# Patient Record
Sex: Female | Born: 1990 | Race: Black or African American | Hispanic: No | Marital: Single | Smoking: Never smoker
Health system: Southern US, Community
[De-identification: ages and names within clinical notes are randomized; demographics above are authoritative.]

## PROBLEM LIST (undated history)

## (undated) DIAGNOSIS — J45909 Unspecified asthma, uncomplicated: Secondary | ICD-10-CM

## (undated) HISTORY — PX: REFRACTIVE SURGERY: SHX103

---

## 2013-06-08 ENCOUNTER — Emergency Department (HOSPITAL_COMMUNITY)
Admission: EM | Admit: 2013-06-08 | Discharge: 2013-06-08 | Disposition: A | Discharge status: Home / Self Care | Payer: Federal, State, Local not specified - PPO | Attending: Emergency Medicine | Admitting: Emergency Medicine

## 2013-06-08 ENCOUNTER — Encounter (HOSPITAL_COMMUNITY): Payer: Self-pay | Admitting: *Deleted

## 2013-06-08 DIAGNOSIS — N949 Unspecified condition associated with female genital organs and menstrual cycle: Secondary | ICD-10-CM | POA: Insufficient documentation

## 2013-06-08 DIAGNOSIS — Z3202 Encounter for pregnancy test, result negative: Secondary | ICD-10-CM | POA: Insufficient documentation

## 2013-06-08 DIAGNOSIS — N926 Irregular menstruation, unspecified: Secondary | ICD-10-CM

## 2013-06-08 DIAGNOSIS — R110 Nausea: Secondary | ICD-10-CM | POA: Insufficient documentation

## 2013-06-08 DIAGNOSIS — N938 Other specified abnormal uterine and vaginal bleeding: Secondary | ICD-10-CM | POA: Insufficient documentation

## 2013-06-08 HISTORY — DX: Unspecified asthma, uncomplicated: J45.909

## 2013-06-08 LAB — WET PREP, GENITAL: Clue Cells Wet Prep HPF POC: NONE SEEN

## 2013-06-08 LAB — URINALYSIS, ROUTINE W REFLEX MICROSCOPIC
Bilirubin Urine: NEGATIVE
Protein, ur: NEGATIVE mg/dL
Urobilinogen, UA: 0.2 mg/dL (ref 0.0–1.0)

## 2013-06-08 LAB — POCT PREGNANCY, URINE: Preg Test, Ur: NEGATIVE

## 2013-06-08 LAB — URINE MICROSCOPIC-ADD ON

## 2013-06-08 MED ORDER — CEFTRIAXONE SODIUM 250 MG IJ SOLR
250.0000 mg | Freq: Once | INTRAMUSCULAR | Status: AC
  Administered 2013-06-08: 250 mg via INTRAMUSCULAR
  Filled 2013-06-08: qty 250

## 2013-06-08 MED ORDER — AZITHROMYCIN 1 G PO PACK
1.0000 g | PACK | Freq: Once | ORAL | Status: AC
  Administered 2013-06-08: 1 g via ORAL
  Filled 2013-06-08: qty 1

## 2013-06-08 MED ORDER — ONDANSETRON HCL 4 MG PO TABS: 4.0000 mg | ORAL_TABLET | Freq: Four times a day (QID) | ORAL | Status: DC

## 2013-06-08 NOTE — ED Provider Notes (Signed)
CSN: [id number]     Arrival date & time 06/08/13  1025 History   First MD Initiated Contact with Patient 06/08/13 1114     Chief Complaint  Patient presents with  . Asthma   (Consider location/radiation/quality/duration/timing/severity/associated sxs/prior Treatment) The history is provided by the patient. No language interpreter was used.  Isabel Scott is a 22 year old female with past medical history of asthma presenting to emergency department with vaginal discomfort has been ongoing starting this morning. Patient reported that when she woke up this morning she had a sharp pain in her vaginal region that lasted approximately 5-10 minutes, reported no more than 10 minutes. Patient reported that she normally gets vaginal discomfort when starting her menstruation, reported this was a different type of discomfort. Patient reported that she did notice that she had vaginal bleeding this morning, reported that her last menstrual period was 04/21/2013. Patient reported that she was due for her menstruation. Nothing makes the discomfort better or worse, denied using anything for the discomfort. Patient reported that she had her first sexual encounter on 04/12/2013, reported using no protection. Denied birth control. Patient denied any radiation. Patient denied fever, chills, chest pain, shortness of breath, difficulty breathing, neck pain, back pain, dysuria, sores, and vaginal discharge, abdominal pain, vomiting, diarrhea, melena, hematochezia. PCP none  Past Medical History  Diagnosis Date  . Asthma    History reviewed. No pertinent past surgical history. History reviewed. No pertinent family history. History  Substance Use Topics  . Smoking status: Not on file  . Smokeless tobacco: Not on file  . Alcohol Use: No   OB History   Grav Para Term Preterm Abortions TAB SAB Ect Mult Living                 Review of Systems  Constitutional: Negative for fever and chills.  HENT: Negative  for sore throat and trouble swallowing.   Eyes: Negative for visual disturbance.  Respiratory: Negative for chest tightness, shortness of breath and wheezing.   Cardiovascular: Negative for chest pain.  Gastrointestinal: Positive for nausea. Negative for vomiting, abdominal pain and diarrhea.  Genitourinary: Positive for vaginal bleeding and vaginal pain. Negative for dysuria, decreased urine volume, vaginal discharge and genital sores.  Musculoskeletal: Negative for back pain.  Neurological: Negative for dizziness and weakness.  All other systems reviewed and are negative.    Allergies  Apple  Home Medications   Current Outpatient Rx  Name  Route  Sig  Dispense  Refill  . ondansetron (ZOFRAN) 4 MG tablet   Oral   Take 1 tablet (4 mg total) by mouth every 6 (six) hours.   12 tablet   0    BP 102/80  Pulse 108  Temp(Src) 98.2 F (36.8 C) (Oral)  Resp 18  SpO2 100%  LMP 04/21/2013 Physical Exam  Nursing note and vitals reviewed. Constitutional: She is oriented to person, place, and time. She appears well-developed and well-nourished. No distress.  HENT:  Head: Normocephalic and atraumatic.  Mouth/Throat: Oropharynx is clear and moist. No oropharyngeal exudate.  No oral lesions or sores identified to the buccal mucosa and gum line  Eyes: Conjunctivae and EOM are normal. Pupils are equal, round, and reactive to light. Right eye exhibits no discharge. Left eye exhibits no discharge.  Neck: Normal range of motion. Neck supple.  Cardiovascular: Normal rate, regular rhythm and normal heart sounds.  Exam reveals no friction rub.   No murmur heard. Pulses:      Radial pulses  are 2+ on the right side, and 2+ on the left side.       Dorsalis pedis pulses are 2+ on the right side, and 2+ on the left side.  Pulmonary/Chest: Effort normal and breath sounds normal. No respiratory distress. She has no wheezes. She has no rales.  Abdominal: Soft. Bowel sounds are normal. She exhibits no  distension. There is tenderness. There is no rebound and no guarding.  Suprapubic discomfort identified  Genitourinary: Vagina normal.  Negative swelling, erythema, inflammation, sores, lesions noted to the external genitalia. Negative swelling, erythema, inflammation, sores, lesions noted to the vaginal canal. Cervix normal appearance without inflammation, erythema, strawberry appearance identified. Bleeding noted from the cervical os-patient currently menstruating. Negative bilateral adnexal tenderness. Negative CMT. Mild discomfort upon palpation to suprapubic region.  Musculoskeletal: Normal range of motion.  Lymphadenopathy:    She has no cervical adenopathy.  Neurological: She is alert and oriented to person, place, and time. She exhibits normal muscle tone. Coordination normal.  Skin: Skin is warm and dry. No rash noted. She is not diaphoretic. No erythema.  Psychiatric: She has a normal mood and affect. Her behavior is normal. Thought content normal.    ED Course  Procedures (including critical care time)  2:37 PM This provider at bedside discussing lab findings with patient. Discussed plan for discharge and patient agreed. Patient reported that she has been having onset of dizziness, weakness, and sweating with her first day of her peirods - has been ongoing for the past couple of months. Patient erporte dthat it only occurs with the first day of periods and then never comes back. Patient denied following up with her OBGYN regarding this. Patient reported that she is okay now. Denied chest pain, shortness of breath, difficulty breathing, abdominal pain, sweating, weakness, chills. Patient reported that she has been rather stressed out regarding school and home - patient reported that she is okay and denied depression. Patient reported that she has had a history of SI, but denied SI, HI, and self-inflicted injury - patient reported that she has not had these thoughts for a long time. Discussed  with patient safe sex habits and to be re-evaluated by OBGYN.   Labs Review Labs Reviewed  WET PREP, GENITAL - Abnormal; Notable for the following:    WBC, Wet Prep HPF POC FEW (*)    All other components within normal limits  URINALYSIS, ROUTINE W REFLEX MICROSCOPIC - Abnormal; Notable for the following:    APPearance CLOUDY (*)    Hgb urine dipstick SMALL (*)    All other components within normal limits  GC/CHLAMYDIA PROBE AMP  URINE MICROSCOPIC-ADD ON  POCT PREGNANCY, URINE   Imaging Review No results found.  MDM   1. Late menstruation     Patient presenting to emergency department with vaginal pain that has been ongoing since this morning. Patient reported that she just started menstruating this morning. Patient reported that she normally gets the discomfort within the vaginal region when starting her menstruation, reported that this discomfort was different. Alert and oriented. Negative acute abdomen, negative peritoneal signs. Mild discomfort upon palpation to the suprapubic region. Unremarkable pelvic exam-patient currently menstruating. Urine pregnancy negative. Urinalysis identified small amount of hemoglobin-patient currently menstruating-negative findings infection or pyelonephritis. Wet prep with few white blood cells identified. Patient stable, afebrile. Menstruation with menorrhagia. Discharged patient. Patient covered prophylactically. Discussed with patient to follow-up with OBGYN by next week. Discussed with patient to refrain from sexually activity - educated patient on safe  sex habits. Discussed with patient to get iron check to see if there may be a deficiency. Recommended over the counter vitamins, daily, to be taken. Discussed with patient to continue to monitor symptoms and if symptoms are to worsen or change to report back to the ED - strict return instructions given.  Patient agreed to plan of care, understood, all questions answered.     Raymon Mutton,  PA-C 06/09/13 [phone]

## 2013-06-08 NOTE — ED Notes (Addendum)
Pt arrived to ed very anxious and hyperventilating. Pt and family member reports hx of asthma, spo2 100% at triage. Pt calmed down at triage, slowed resp rate down. No wheezing noted at triage.

## 2013-06-08 NOTE — ED Notes (Signed)
Pelvic cart set up and ready 

## 2013-06-09 NOTE — ED Provider Notes (Signed)
Medical screening examination/treatment/procedure(s) were performed by non-physician practitioner and as supervising physician I was immediately available for consultation/collaboration.   Charles B. Sheldon, MD 06/09/13 1922 

## 2013-07-26 ENCOUNTER — Encounter: Payer: Self-pay | Admitting: Obstetrics & Gynecology

## 2013-07-26 ENCOUNTER — Encounter: Payer: Self-pay | Admitting: Nurse Practitioner

## 2013-07-26 ENCOUNTER — Ambulatory Visit (INDEPENDENT_AMBULATORY_CARE_PROVIDER_SITE_OTHER): Payer: Federal, State, Local not specified - PPO | Admitting: Nurse Practitioner

## 2013-07-26 VITALS — BP 127/75 | HR 83 | Temp 98.7°F | Ht 69.0 in | Wt 199.0 lb

## 2013-07-26 DIAGNOSIS — Z309 Encounter for contraceptive management, unspecified: Secondary | ICD-10-CM | POA: Insufficient documentation

## 2013-07-26 NOTE — Progress Notes (Signed)
Subjective:     Patient ID: Isabel Scott, female   DOB: 05/07/1991, 22 y.o.   MRN: [id number]  HPI Comments: Pt comes to [facility] today following an ER visit in which she had some unusual sensations following an asthma attack. The ER felt that she should get started on some birth control following one sexual experience. She was given Plan B by Dr Fredia Sorrow. She felt this was a one time situation in which she wanted to experience sex only. She is not interested in birth control and does not feel she will be sexually active again. She is in school for Biology and wants to go to PA school in [place].      Review of Systems  Constitutional: Negative.   HENT: Negative.   Eyes: Negative.   Respiratory: Negative.   Cardiovascular: Negative.   Gastrointestinal: Negative.   Endocrine: Negative.   Genitourinary: Negative.   Neurological: Negative.   Hematological: Negative.   Psychiatric/Behavioral: Negative.        Objective:   Physical Exam  Constitutional: She is oriented to person, place, and time. She appears well-developed and well-nourished.  HENT:  Head: Normocephalic and atraumatic.  Eyes: Conjunctivae are normal. Pupils are equal, round, and reactive to light.  Neck: Normal range of motion.  Cardiovascular: Normal rate, regular rhythm and normal heart sounds.   Pulmonary/Chest: Breath sounds normal.  Abdominal: Bowel sounds are normal.  Genitourinary:  Not examined  Musculoskeletal: Normal range of motion.  Neurological: She is alert and oriented to person, place, and time.  Skin: Skin is warm and dry.  Psychiatric: She has a normal mood and affect. Her behavior is normal. Judgment normal.       Assessment:     Contraception    Plan:     Continue with Plan B as needed Return if she does start relationship RTC one year for first pap smear

## 2013-07-26 NOTE — Patient Instructions (Signed)
Contraception Choices  Contraception (birth control) is the use of any methods or devices to prevent pregnancy. Below are some methods to help avoid pregnancy.  HORMONAL METHODS   · Contraceptive implant. This is a thin, plastic tube containing progesterone hormone. It does not contain estrogen hormone. Your caregiver inserts the tube in the inner part of the upper arm. The tube can remain in place for up to 3 years. After 3 years, the implant must be removed. The implant prevents the ovaries from releasing an egg (ovulation), thickens the cervical mucus which prevents sperm from entering the uterus, and thins the lining of the inside of the uterus.  · Progesterone-only injections. These injections are given every 3 months by your caregiver to prevent pregnancy. This synthetic progesterone hormone stops the ovaries from releasing eggs. It also thickens cervical mucus and changes the uterine lining. This makes it harder for sperm to survive in the uterus.  · Birth control pills. These pills contain estrogen and progesterone hormone. They work by stopping the egg from forming in the ovary (ovulation). Birth control pills are prescribed by a caregiver. Birth control pills can also be used to treat heavy periods.  · Minipill. This type of birth control pill contains only the progesterone hormone. They are taken every day of each month and must be prescribed by your caregiver.  · Birth control patch. The patch contains hormones similar to those in birth control pills. It must be changed once a week and is prescribed by a caregiver.  · Vaginal ring. The ring contains hormones similar to those in birth control pills. It is left in the vagina for 3 weeks, removed for 1 week, and then a new one is put back in place. The patient must be comfortable inserting and removing the ring from the vagina. A caregiver's prescription is necessary.  · Emergency contraception. Emergency contraceptives prevent pregnancy after unprotected  sexual intercourse. This pill can be taken right after sex or up to 5 days after unprotected sex. It is most effective the sooner you take the pills after having sexual intercourse. Emergency contraceptive pills are available without a prescription. Check with your pharmacist. Do not use emergency contraception as your only form of birth control.  BARRIER METHODS   · Female condom. This is a thin sheath (latex or rubber) that is worn over the penis during sexual intercourse. It can be used with spermicide to increase effectiveness.  · Female condom. This is a soft, loose-fitting sheath that is put into the vagina before sexual intercourse.  · Diaphragm. This is a soft, latex, dome-shaped barrier that must be fitted by a caregiver. It is inserted into the vagina, along with a spermicidal jelly. It is inserted before intercourse. The diaphragm should be left in the vagina for 6 to 8 hours after intercourse.  · Cervical cap. This is a round, soft, latex or plastic cup that fits over the cervix and must be fitted by a caregiver. The cap can be left in place for up to 48 hours after intercourse.  · Sponge. This is a soft, circular piece of polyurethane foam. The sponge has spermicide in it. It is inserted into the vagina after wetting it and before sexual intercourse.  · Spermicides. These are chemicals that kill or block sperm from entering the cervix and uterus. They come in the form of creams, jellies, suppositories, foam, or tablets. They do not require a prescription. They are inserted into the vagina with an applicator before having sexual intercourse.   The process must be repeated every time you have sexual intercourse.  INTRAUTERINE CONTRACEPTION  · Intrauterine device (IUD). This is a T-shaped device that is put in a woman's uterus during a menstrual period to prevent pregnancy. There are 2 types:  · Copper IUD. This type of IUD is wrapped in copper wire and is placed inside the uterus. Copper makes the uterus and  fallopian tubes produce a fluid that kills sperm. It can stay in place for 10 years.  · Hormone IUD. This type of IUD contains the hormone progestin (synthetic progesterone). The hormone thickens the cervical mucus and prevents sperm from entering the uterus, and it also thins the uterine lining to prevent implantation of a fertilized egg. The hormone can weaken or kill the sperm that get into the uterus. It can stay in place for 5 years.  PERMANENT METHODS OF CONTRACEPTION  · Female tubal ligation. This is when the woman's fallopian tubes are surgically sealed, tied, or blocked to prevent the egg from traveling to the uterus.  · Female sterilization. This is when the female has the tubes that carry sperm tied off (vasectomy). This blocks sperm from entering the vagina during sexual intercourse. After the procedure, the man can still ejaculate fluid (semen).  NATURAL PLANNING METHODS  · Natural family planning. This is not having sexual intercourse or using a barrier method (condom, diaphragm, cervical cap) on days the woman could become pregnant.  · Calendar method. This is keeping track of the length of each menstrual cycle and identifying when you are fertile.  · Ovulation method. This is avoiding sexual intercourse during ovulation.  · Symptothermal method. This is avoiding sexual intercourse during ovulation, using a thermometer and ovulation symptoms.  · Post-ovulation method. This is timing sexual intercourse after you have ovulated.  Regardless of which type or method of contraception you choose, it is important that you use condoms to protect against the transmission of sexually transmitted diseases (STDs). Talk with your caregiver about which form of contraception is most appropriate for you.  Document Released: 09/21/2005 Document Revised: 12/14/2011 Document Reviewed: 01/28/2011  [employer]® Patient Information ©2014 [employer].

## 2013-10-04 HISTORY — PX: TOOTH EXTRACTION: SUR596

## 2013-10-08 ENCOUNTER — Emergency Department (HOSPITAL_COMMUNITY): Payer: Federal, State, Local not specified - PPO

## 2013-10-08 ENCOUNTER — Emergency Department (HOSPITAL_COMMUNITY)
Admission: EM | Admit: 2013-10-08 | Discharge: 2013-10-08 | Disposition: A | Discharge status: Home / Self Care | Payer: Federal, State, Local not specified - PPO | Attending: Emergency Medicine | Admitting: Emergency Medicine

## 2013-10-08 ENCOUNTER — Encounter (HOSPITAL_COMMUNITY): Payer: Self-pay | Admitting: Emergency Medicine

## 2013-10-08 DIAGNOSIS — J45909 Unspecified asthma, uncomplicated: Secondary | ICD-10-CM | POA: Insufficient documentation

## 2013-10-08 DIAGNOSIS — R079 Chest pain, unspecified: Secondary | ICD-10-CM | POA: Insufficient documentation

## 2013-10-08 LAB — CBC
HEMATOCRIT: 41.1 % (ref 36.0–46.0)
HEMOGLOBIN: 13.3 g/dL (ref 12.0–15.0)
MCH: 25.3 pg — AB (ref 26.0–34.0)
MCHC: 32.4 g/dL (ref 30.0–36.0)
MCV: 78.3 fL (ref 78.0–100.0)
Platelets: 214 10*3/uL (ref 150–400)
RBC: 5.25 MIL/uL — AB (ref 3.87–5.11)
RDW: 12.9 % (ref 11.5–15.5)
WBC: 6.2 10*3/uL (ref 4.0–10.5)

## 2013-10-08 LAB — BASIC METABOLIC PANEL
BUN: 9 mg/dL (ref 6–23)
CALCIUM: 8.8 mg/dL (ref 8.4–10.5)
CO2: 28 meq/L (ref 19–32)
Chloride: 101 mEq/L (ref 96–112)
Creatinine, Ser: 0.78 mg/dL (ref 0.50–1.10)
GFR calc Af Amer: >90 mL/min (ref >90)
GLUCOSE: 114 mg/dL — AB (ref 70–99)
Potassium: 3.8 mEq/L (ref 3.7–5.3)
Sodium: 139 mEq/L (ref 137–147)

## 2013-10-08 LAB — POCT I-STAT TROPONIN I: Troponin i, poc: 0 ng/mL (ref 0.00–0.08)

## 2013-10-08 MED ORDER — FAMOTIDINE 20 MG PO TABS: 20.0000 mg | ORAL_TABLET | Freq: Two times a day (BID) | ORAL | Status: DC

## 2013-10-08 MED ORDER — GI COCKTAIL ~~LOC~~
30.0000 mL | Freq: Once | ORAL | Status: AC
  Administered 2013-10-08: 30 mL via ORAL
  Filled 2013-10-08: qty 30

## 2013-10-08 NOTE — ED Notes (Signed)
Patient returned from X-ray 

## 2013-10-08 NOTE — ED Notes (Signed)
Pt c/o intermittent left sided chest pain for a couple of days. Pt reports when she burps she gets a burning pain in her left chest area. Pt currently pain free at present.

## 2013-10-08 NOTE — ED Notes (Signed)
Patient transported to X-ray 

## 2013-10-08 NOTE — Discharge Instructions (Signed)
Take Pepcid for reflux - twice daily (this is over the counter as well)  Try Maalox as well to help treat your symptoms  Follow-up with your doctor  Return to the ED if you have any difficulty swallowing/breathing, change/worsening chest pain, repeated vomiting, difficulty breathing/shortness of breath, fever, or any other concerns (see below)     Chest Pain (Nonspecific) It is often hard to give a specific diagnosis for the cause of chest pain. There is always a chance that your pain could be related to something serious, such as a heart attack or a blood clot in the lungs. You need to follow up with your caregiver for further evaluation. CAUSES   Heartburn.  Pneumonia or bronchitis.  Anxiety or stress.  Inflammation around your heart (pericarditis) or lung (pleuritis or pleurisy).  A blood clot in the lung.  A collapsed lung (pneumothorax). It can develop suddenly on its own (spontaneous pneumothorax) or from injury (trauma) to the chest.  Shingles infection (herpes zoster virus). The chest wall is composed of bones, muscles, and cartilage. Any of these can be the source of the pain.  The bones can be bruised by injury.  The muscles or cartilage can be strained by coughing or overwork.  The cartilage can be affected by inflammation and become sore (costochondritis). DIAGNOSIS  Lab tests or other studies, such as X-rays, electrocardiography, stress testing, or cardiac imaging, may be needed to find the cause of your pain.  TREATMENT   Treatment depends on what may be causing your chest pain. Treatment may include:  Acid blockers for heartburn.  Anti-inflammatory medicine.  Pain medicine for inflammatory conditions.  Antibiotics if an infection is present.  You may be advised to change lifestyle habits. This includes stopping smoking and avoiding alcohol, caffeine, and chocolate.  You may be advised to keep your head raised (elevated) when sleeping. This reduces the  chance of acid going backward from your stomach into your esophagus.  Most of the time, nonspecific chest pain will improve within 2 to 3 days with rest and mild pain medicine. HOME CARE INSTRUCTIONS   If antibiotics were prescribed, take your antibiotics as directed. Finish them even if you start to feel better.  For the next few days, avoid physical activities that bring on chest pain. Continue physical activities as directed.  Do not smoke.  Avoid drinking alcohol.  Only take over-the-counter or prescription medicine for pain, discomfort, or fever as directed by your caregiver.  Follow your caregiver's suggestions for further testing if your chest pain does not go away.  Keep any follow-up appointments you made. If you do not go to an appointment, you could develop lasting (chronic) problems with pain. If there is any problem keeping an appointment, you must call to reschedule. SEEK MEDICAL CARE IF:   You think you are having problems from the medicine you are taking. Read your medicine instructions carefully.  Your chest pain does not go away, even after treatment.  You develop a rash with blisters on your chest. SEEK IMMEDIATE MEDICAL CARE IF:   You have increased chest pain or pain that spreads to your arm, neck, jaw, back, or abdomen.  You develop shortness of breath, an increasing cough, or you are coughing up blood.  You have severe back or abdominal pain, feel nauseous, or vomit.  You develop severe weakness, fainting, or chills.  You have a fever. THIS IS AN EMERGENCY. Do not wait to see if the pain will go away. Get medical  help at once. Call your local emergency services (911 in U.[place].). Do not drive yourself to the hospital. MAKE SURE YOU:   Understand these instructions.  Will watch your condition.  Will get help right away if you are not doing well or get worse. Document Released: 07/01/2005 Document Revised: 12/14/2011 Document Reviewed:  04/26/2008 [facility] Patient Information 2014 [place], [place].  Gastroesophageal Reflux Disease, Adult Gastroesophageal reflux disease (GERD) happens when acid from your stomach flows up into the esophagus. When acid comes in contact with the esophagus, the acid causes soreness (inflammation) in the esophagus. Over time, GERD may create small holes (ulcers) in the lining of the esophagus. CAUSES   Increased body weight. This puts pressure on the stomach, making acid rise from the stomach into the esophagus.  Smoking. This increases acid production in the stomach.  Drinking alcohol. This causes decreased pressure in the lower esophageal sphincter (valve or ring of muscle between the esophagus and stomach), allowing acid from the stomach into the esophagus.  Late evening meals and a full stomach. This increases pressure and acid production in the stomach.  A malformed lower esophageal sphincter. Sometimes, no cause is found. SYMPTOMS   Burning pain in the lower part of the mid-chest behind the breastbone and in the mid-stomach area. This may occur twice a week or more often.  Trouble swallowing.  Sore throat.  Dry cough.  Asthma-like symptoms including chest tightness, shortness of breath, or wheezing. DIAGNOSIS  Your caregiver may be able to diagnose GERD based on your symptoms. In some cases, X-rays and other tests may be done to check for complications or to check the condition of your stomach and esophagus. TREATMENT  Your caregiver may recommend over-the-counter or prescription medicines to help decrease acid production. Ask your caregiver before starting or adding any new medicines.  HOME CARE INSTRUCTIONS   Change the factors that you can control. Ask your caregiver for guidance concerning weight loss, quitting smoking, and alcohol consumption.  Avoid foods and drinks that make your symptoms worse, such as:  Caffeine or alcoholic drinks.  Chocolate.  Peppermint or mint  flavorings.  Garlic and onions.  Spicy foods.  Citrus fruits, such as oranges, lemons, or limes.  Tomato-based foods such as sauce, chili, salsa, and pizza.  Fried and fatty foods.  Avoid lying down for the 3 hours prior to your bedtime or prior to taking a nap.  Eat small, frequent meals instead of large meals.  Wear loose-fitting clothing. Do not wear anything tight around your waist that causes pressure on your stomach.  Raise the head of your bed 6 to 8 inches with wood blocks to help you sleep. Extra pillows will not help.  Only take over-the-counter or prescription medicines for pain, discomfort, or fever as directed by your caregiver.  Do not take aspirin, ibuprofen, or other nonsteroidal anti-inflammatory drugs (NSAIDs). SEEK IMMEDIATE MEDICAL CARE IF:   You have pain in your arms, neck, jaw, teeth, or back.  Your pain increases or changes in intensity or duration.  You develop nausea, vomiting, or sweating (diaphoresis).  You develop shortness of breath, or you faint.  Your vomit is green, yellow, black, or looks like coffee grounds or blood.  Your stool is red, bloody, or black. These symptoms could be signs of other problems, such as heart disease, gastric bleeding, or esophageal bleeding. MAKE SURE YOU:   Understand these instructions.  Will watch your condition.  Will get help right away if you are not doing well  or get worse. Document Released: 07/01/2005 Document Revised: 12/14/2011 Document Reviewed: 04/10/2011 [facility] Patient Information 2014 [place], [place].   Emergency Department Resource Guide 1) Find a Doctor and Pay Out of Pocket Although you won't have to find out who is covered by your insurance plan, it is a good idea to ask around and get recommendations. You will then need to call the office and see if the doctor you have chosen will accept you as a new patient and what types of options they offer for patients who are self-pay. Some  doctors offer discounts or will set up payment plans for their patients who do not have insurance, but you will need to ask so you aren't surprised when you get to your appointment.  2) Contact Your Local Health Department Not all health departments have doctors that can see patients for sick visits, but many do, so it is worth a call to see if yours does. If you don't know where your local health department is, you can check in your phone book. The CDC also has a tool to help you locate your state's health department, and many state websites also have listings of all of their local health departments.  3) Find a Walk-in Clinic If your illness is not likely to be very severe or complicated, you may want to try a walk in clinic. These are popping up all over the country in pharmacies, drugstores, and shopping centers. They're usually staffed by nurse practitioners or physician assistants that have been trained to treat common illnesses and complaints. They're usually fairly quick and inexpensive. However, if you have serious medical issues or chronic medical problems, these are probably not your best option.  No Primary Care Doctor: - Call Health Connect at  [phone] - they can help you locate a primary care doctor that  accepts your insurance, provides certain services, etc. - Physician Referral Service- [phone]  Chronic Pain Problems: Organization         Address  Phone   Notes  [facility]  [phone] Patients need to be referred by their primary care doctor.   Medication Assistance: Organization         Address  Phone   Notes  [facility] [address] [place], [place] [address] [phone]) [phone] --Must be a resident of [facility] -- Must have NO insurance coverage whatsoever (no Medicaid/ Medicare, etc.) -- The pt. MUST have a primary care doctor that directs their care regularly and follows them in the  community   Med[facility]ist  [phone]   [employer]  [phone]    Agencies that provide inexpensive medical care: Organization         Address  Phone   Notes  [facility]  [phone]   [facility]    [phone]   [facility] [address] [place], [place] [address] [phone]   [facility] [address], [place] [phone]) [phone]   [facility]    [phone]   [facility]    [phone]   [facility]  [address], [place] Phone:  [phone], Fax:  [phone] Hours of Operation:  9 am - 6 pm, M-F.  Also accepts Medicaid/Medicare and self-pay.  [facility]  [address], [place] Phone: [phone]) [phone], Fax: [phone].  Hours of Operation:  8:30 am - 5:30 pm, M-F.  Also accepts Medicaid and self-pay.  [facility] [place] [address], [place] Phone: [phone]   [facility] [address] Natasha Bence [place], [place] [phone], Ext. [phone] Mondays & Thursdays: 7-9 AM.  First 15 patients are seen on a first come, first serve basis.    Medicaid-accepting [facility] Providers:  Organization         Address  Phone   Notes  [facility] [address], [place] [phone] Also accepts self-pay patients.  [facility] [address] Laurell Josephs [place], [place]  [phone]   [facility] [address], [place] [phone]   [facility] [address], [place] [phone]   Renaye Rakers [address], [place]   [phone] Only accepts [place] patients after they have their name applied to their card.   Self-Pay (no insurance) in [facility]:  Organization         Address  Phone   Notes  Sickle Cell Patients, [facility]  Internal Medicine [address] [place], [place] [phone]   [facility] [address] [place], [place] [phone]   [facility]  [address], [place] [phone]   [facility]/Dr. Osei-Bonsu  [address] [place] or [address], [place] [phone] Phone number for both [place] locations is the same.  [facility] [address], [place] [phone]   [facility] [address], [place] or [address] [phone]   [facility] [address] [place] [phone], phone; [phone], fax Sees patients 1st and 3rd Saturday of every month.  Must not qualify for public or private insurance (i.e. Medicaid, Medicare, [place] Health Choice, Veterans' Benefits)  Household income should be no more than 200% of the poverty level The clinic cannot treat you if you are pregnant or think you are pregnant  Sexually transmitted diseases are not treated at the clinic.    Dental Care: Organization         Address  Phone  Notes  [facility] Department of [facility] [address] [place], [place] [phone] Accepts children up to age 12 who are enrolled in [place] or [place] Health Choice; pregnant women with a Medicaid card; and children who have applied for Medicaid or [place] Health Choice, but were declined, whose parents can pay a reduced fee at time of service.  [facility]  [address], [place] [phone] Accepts children up to age 15 who are enrolled in [place] or [place] Health Choice; pregnant women with a Medicaid card; and children who have applied for Medicaid or [place] Health Choice, but were declined, whose parents can pay a reduced fee at time of service.  [facility]  [address] [place], [place] [phone] Patients are seen by appointment only. Walk-ins are not accepted. [facility] will see patients 36 years of age and older. Monday - Tuesday (8am-5pm) Most Wednesdays (8:30-5pm) $30 per visit, cash only  [facility] [employer] PRO[facility]  [address], [facility] [phone] Patients  are seen by appointment only. Walk-ins are not accepted. [facility] will see patients 19 years of age and older. One Wednesday Evening (Monthly: Volunteer Based).  $30 per visit, cash only  [employer]  [phone] for adults; Children under age 5, call Graduate Pediatric Dentistry at [phone]. Children aged 25-14, please call [phone] to request a pediatric application.  Dental services are provided in all areas of dental care including fillings, crowns and bridges, complete and partial dentures, implants, gum treatment, root canals, and extractions. Preventive care is also provided. Treatment is provided to both adults and children. Patients are selected via a lottery and there is often a waiting list.   [facility] [address], [address]  [phone] [email or web address]   [facility] [address] [place], [place] [phone], Ext. [phone] Second and Fourth Thursday of each month, opens at 6:30 AM; Clinic ends at 9 AM.  Patients are seen on a first-come first-served basis, and a limited number are seen during each clinic.   [facility]  [address] Ether Griffins [place], [place] [phone]   Eligibility Requirements You must have lived in [place], [place], or [place] counties for at least the last three months.   You cannot be eligible for state or federal sponsored [place], including [employer], [place], or [employer].   You generally cannot be eligible for healthcare insurance through your employer.    How to apply: Eligibility screenings are held every  Tuesday and Wednesday afternoon from 1:00 pm until 4:00 pm. You do not need an appointment for the interview!  [facility] [address] [place], [place] [phone]   [facility] Health Department  [phone]   [facility] Health Department  [phone]   [facility] Health Department  [phone]    Behavioral Health Resources in the Community: Intensive Outpatient Programs Organization         Address  Phone  Notes  [facility] Services [address] [place], [place] [phone]   [facility] [address] [place], [place] [phone]   [facility] [address], [place], [place]  [phone]   [facility] [address]  [place], [place] [phone] or [phone]   Substance Abuse Resources Organization         Address  Phone  Notes  [facility]  [phone]   [facility]  [phone]   [facility] [place]  [phone]   Floydene Flock  [phone]   Residential & Outpatient Substance Abuse Program  [phone]   Psychological Services Organization         Address  Phone  Notes  [facility]  [phone]   [facility]  [phone]   [facility] [address] [place] [phone] or [phone]    Mobile Crisis Teams Organization         Address  Phone  Notes  [facility], Mobile Crisis Care Unit  [phone]   [facility]  [address] [place], [place] [phone]   Doristine Locks [address] [place] [phone]    Self-Help/Support Groups Organization         Address  Phone             Notes  [facility] [place] - variety of support groups  336- I7437963 Call for  more information  Narcotics Anonymous (NA), [facility] [address], [employer] [place]  2 meetings at this location    Residential Sports administrator         Address  Phone  Notes  [facility] [address],    [place]  [phone]   [facility]  [address] [place] [address], [place], [place] [phone]   [facility] [address] [place], [place] [phone] Admissions: 8am-3pm M-F  [facility] [address]    [place], [place] [phone]   [facility] [address] [place], [place], [place] [phone]   [facility] [address]  [place], [place] [phone]   [facility] - Intensive Outpatient [address] Laurell Josephs [address], [place], [place] [phone]   [facility] ([facility] [address]  [place], [place] [phone] or [phone]   [facility] ([facility]) [address] [place], [place] [phone] Accepts Medicaid  [facility] [address],  [place] [phone] Substance Abuse/Addiction Treatment   [facility] Organization         Address  Phone  Notes  [facility]  [phone]   Angie Fava, PhD [address] Ervin Knack [place], [place]   [phone] or [phone]   [facility]   [address] [place], [place] [phone]   [facility] [address] [place], [place] [phone] Insurance/Medicaid/sponsorship through [facility] and Families [address]                                    [place], [place] [phone] Therapy/tele-psych/case  [facility] [address], [place] [phone]    Dr. Lolly Mustache  [phone]   [facility]  [employer] [facility] Dept. 1) [address] [place] 2) [address], [place] 3)  [address], [place] [phone]  [phone]   [facility] Child Abuse Hotline 321-150-5311 or [phone] (After  Hours)

## 2013-10-08 NOTE — ED Notes (Signed)
Patient states she had an episode of the burning while in X-Ray, but is was much less than before the GI cocktail. States she feels much better and is smiling.

## 2013-10-08 NOTE — ED Provider Notes (Signed)
CSN: [id number]     Arrival date & time 10/08/13  1736 History   First MD Initiated Contact with Patient 10/08/13 1826     Chief Complaint  Patient presents with  . Chest Pain    HPI  Isabel Scott is a 23 y.o. female with a PMH of asthma who presents to the ED for evaluation of chest pain.  History was provided by the patient.  Patient states that she has had intermittent chest pain for the past 3 days. Her chest pain has been changing in nature. Chest pain initially was located on the left side of her chest but is now located more in the mid sternal region. She describes her pain as a burning sensation. Her pain comes and goes in waves. Her pain lasts a few seconds to minutes in duration. Time between episodes varies between minutes and hours. She's never had chest pain like this in the past. She states that she has been burping, which seems to help her pain. She denies any chest pain currently. She has not taken anything to help treat her symptoms. She denies any associated symptoms with her chest pain including diaphoresis, shortness of breath, nausea, or vomiting. She otherwise been well with no fever, chills, rhinorrhea, cough, abdominal pain, diarrhea, headache, or weakness. She states she's been eating and drinking without difficulty however sometimes feels like her "food gets stuck" sometimes.  Patient denies any history of cardiac problems in the past. She denies any history of DVT/PE/cancer/recent travel.  No hx of tobacco use.  No significant FH of cardiac disease.     Past Medical History  Diagnosis Date  . Asthma    Past Surgical History  Procedure Laterality Date  . Tooth extraction     No family history on file. History  Substance Use Topics  . Smoking status: Never Smoker   . Smokeless tobacco: Not on file  . Alcohol Use: No   OB History   Grav Para Term Preterm Abortions TAB SAB Ect Mult Living                 Review of Systems  Constitutional: Negative for  fever, chills, diaphoresis, activity change, appetite change and fatigue.  HENT: Negative for congestion, rhinorrhea and sore throat.   Eyes: Negative for visual disturbance.  Respiratory: Negative for cough, shortness of breath and wheezing.   Cardiovascular: Positive for chest pain. Negative for palpitations and leg swelling.  Gastrointestinal: Negative for nausea, vomiting, abdominal pain, diarrhea and constipation.  Genitourinary: Negative for dysuria.  Musculoskeletal: Negative for back pain and myalgias.  Skin: Negative for wound.  Neurological: Negative for dizziness, weakness, light-headedness and headaches.    Allergies  Apple  Home Medications  No current outpatient prescriptions on file. BP 140/85  Pulse 97  Temp(Src) 98.8 F (37.1 C) (Oral)  Resp 18  Ht 5\' 9"  (1.753 m)  Wt 194 lb (87.998 kg)  BMI 28.64 kg/m2  SpO2 100%  LMP 09/13/2013  Filed Vitals:   10/08/13 1745 10/08/13 1910 10/08/13 1951 10/08/13 2045  BP: 140/85 115/60 105/56 111/57  Pulse: 97 78 64 58  Temp: 98.8 F (37.1 C)     TempSrc: Oral     Resp: 18 20 17 13   Height: 5\' 9"  (1.753 m)     Weight: 194 lb (87.998 kg)     SpO2: 100% 100% 100% 99%    Physical Exam  Nursing note and vitals reviewed. Constitutional: She is oriented to person, place, and  time. She appears well-developed and well-nourished. No distress.  HENT:  Head: Normocephalic and atraumatic.  Right Ear: External ear normal.  Left Ear: External ear normal.  Nose: Nose normal.  Mouth/Throat: Oropharynx is clear and moist. No oropharyngeal exudate.  Eyes: Conjunctivae are normal. Right eye exhibits no discharge. Left eye exhibits no discharge.  Neck: Normal range of motion. Neck supple.  Cardiovascular: Normal rate, regular rhythm, normal heart sounds and intact distal pulses.  Exam reveals no gallop and no friction rub.   No murmur heard. Dorsalis pedis pulses present and equal bilaterally  Pulmonary/Chest: Effort normal and  breath sounds normal. No respiratory distress. She has no wheezes. She has no rales. She exhibits no tenderness.  Abdominal: Soft. Bowel sounds are normal. She exhibits no distension and no mass. There is no tenderness. There is no rebound and no guarding.  Musculoskeletal: Normal range of motion. She exhibits no edema and no tenderness.  No pedal edema or calf tenderness bilaterally.    Neurological: She is alert and oriented to person, place, and time.  Skin: Skin is warm and dry. She is not diaphoretic.    ED Course  Procedures (including critical care time) Labs Review Labs Reviewed  CBC - Abnormal; Notable for the following:    RBC 5.25 (*)    MCH 25.3 (*)    All other components within normal limits  BASIC METABOLIC PANEL - Abnormal; Notable for the following:    Glucose, Bld 114 (*)    All other components within normal limits  POCT I-STAT TROPONIN I   Imaging Review No results found.  EKG Interpretation    Date/Time:  Sunday October 08 2013 17:38:55 EST Ventricular Rate:  97 PR Interval:  142 QRS Duration: 74 QT Interval:  366 QTC Calculation: 464 R Axis:   85 Text Interpretation:  Normal sinus rhythm Right atrial enlargement Borderline ECG No old tracing to compare Confirmed by Ethelda Chick  MD, SAM ([id number]) on 10/08/2013 8:32:49 PM           Results for orders placed during the hospital encounter of 10/08/13  CBC      Result Value Range   WBC 6.2  4.0 - 10.5 K/uL   RBC 5.25 (*) 3.87 - 5.11 MIL/uL   Hemoglobin 13.3  12.0 - 15.0 g/dL   HCT 65.7  84.6 - 96.2 %   MCV 78.3  78.0 - 100.0 fL   MCH 25.3 (*) 26.0 - 34.0 pg   MCHC 32.4  30.0 - 36.0 g/dL   RDW 95.2  84.1 - 32.4 %   Platelets 214  150 - 400 K/uL  BASIC METABOLIC PANEL      Result Value Range   Sodium 139  137 - 147 mEq/L   Potassium 3.8  3.7 - 5.3 mEq/L   Chloride 101  96 - 112 mEq/L   CO2 28  19 - 32 mEq/L   Glucose, Bld 114 (*) 70 - 99 mg/dL   BUN 9  6 - 23 mg/dL   Creatinine, Ser 4.01  0.50 -  1.10 mg/dL   Calcium 8.8  8.4 - 02.7 mg/dL   GFR calc non Af Amer >90  >90 mL/min   GFR calc Af Amer >90  >90 mL/min  POCT I-STAT TROPONIN I      Result Value Range   Troponin i, poc 0.00  0.00 - 0.08 ng/mL   Comment 3  DG Chest 2 View (Final result)  Result time: 10/08/13 19:32:05    Final result by Rad Results In Interface (10/08/13 19:32:05)    Narrative:   CLINICAL DATA: Chest pain  EXAM: CHEST 2 VIEW  COMPARISON: None.  FINDINGS: Normal cardiac silhouette. Levoscoliosis of the thoracic spine. No effusion, infiltrate, pneumothorax. No acute osseous abnormality.  IMPRESSION: No acute cardiopulmonary process.   Electronically Signed By: Genevive BiStewart Edmunds M.D. On: 10/08/2013 19:32    MDM   Laurin Coderurquoia Linward HeadlandMeshell Arnold is a 23 y.o. female with a PMH of asthma who presents to the ED for evaluation of chest pain.     Rechecks  8:00 PM = Patient states she feels much better after GI cocktail.  Eating crackers and drinking fluids with no difficulty.      Patient evaluated in the emergency department for chest pain. Chest pain likely GI related, possibly reflux/GERD. Patient had improvement in her symptoms after GI cocktail. She describes a burning epigastric pain. Abdominal exam benign.  Her troponin was negative. EKG negative for any acute ischemic changes. Chest x-ray negative for an acute cardiopulmonary process. Labs otherwise unremarkable. Patient afebrile and non-toxic.  Patient is young healthy female with no risk factors for cardiac disease. She has no respiratory complaints including cough, shortness of breath or dyspnea. Patient was prescribed Pepcid and encouraged to try over-the-counter Maalox. She was encouraged to followup with her [facility] who she still is established with. Return precautions, discharge instructions, and follow-up was discussed with the patient before discharge.  Patient was in agreement with discharge  and plan.     Discharge Medication List as of 10/08/2013  8:50 PM    START taking these medications   Details  famotidine (PEPCID) 20 MG tablet Take 1 tablet (20 mg total) by mouth 2 (two) times daily., Starting 10/08/2013, Until Discontinued, Print         Final impressions: 1. Chest pain      Luiz IronJessica Katlin Suleika Donavan PA-C   This patient was discussed with Dr. Ethelda ChickJacubowitz who did not evaluate the patient         Jillyn LedgerJessica K Bethanny Toelle, PA-C 10/09/13 0104

## 2013-10-11 NOTE — ED Provider Notes (Signed)
Medical screening examination/treatment/procedure(s) were performed by non-physician practitioner and as supervising physician I was immediately available for consultation/collaboration.  EKG Interpretation    Date/Time:  Sunday October 08 2013 17:38:55 EST Ventricular Rate:  97 PR Interval:  142 QRS Duration: 74 QT Interval:  366 QTC Calculation: 464 R Axis:   85 Text Interpretation:  Normal sinus rhythm Right atrial enlargement Borderline ECG No old tracing to compare Confirmed by Ethelda ChickJACUBOWITZ  MD, Brie Eppard ([id number]) on 10/08/2013 8:32:49 PM             Doug SouSam Loma [place], MD 10/11/13 1418

## 2013-11-27 ENCOUNTER — Encounter: Payer: Self-pay | Admitting: Gynecology

## 2013-11-27 ENCOUNTER — Ambulatory Visit (INDEPENDENT_AMBULATORY_CARE_PROVIDER_SITE_OTHER): Payer: Federal, State, Local not specified - PPO | Admitting: Gynecology

## 2013-11-27 VITALS — BP 130/76 | HR 70 | Resp 14 | Ht 68.0 in | Wt 198.0 lb

## 2013-11-27 DIAGNOSIS — Z0000 Encounter for general adult medical examination without abnormal findings: Secondary | ICD-10-CM

## 2013-11-27 DIAGNOSIS — N926 Irregular menstruation, unspecified: Secondary | ICD-10-CM

## 2013-11-27 DIAGNOSIS — Z113 Encounter for screening for infections with a predominantly sexual mode of transmission: Secondary | ICD-10-CM

## 2013-11-27 DIAGNOSIS — Z124 Encounter for screening for malignant neoplasm of cervix: Secondary | ICD-10-CM

## 2013-11-27 DIAGNOSIS — Z01419 Encounter for gynecological examination (general) (routine) without abnormal findings: Secondary | ICD-10-CM

## 2013-11-27 LAB — POCT URINALYSIS DIPSTICK
RBC UA: 2
UROBILINOGEN UA: NEGATIVE
pH, UA: 5

## 2013-11-27 LAB — POCT URINE PREGNANCY: Preg Test, Ur: NEGATIVE

## 2013-11-27 NOTE — Progress Notes (Signed)
23 y.o. Single African American female   [id number] here for annual exam. Pt is currently sexually active.  She reports  using condoms on a regular basis.  First sexual activity at 23 years old, 2  number of lifetime partners.  Pt reports that her cycles seem irregular, she will sometimes skip weeks.  She has never tracked her cycles.  Current partner since 10/14.    Patient's last menstrual period was 11/27/2013.          Sexually active: yes  The current method of family planning is condoms most of the time.    Exercising: yes  walking qd Last pap: 04/2013 Alcohol: no Tobacco: no Drugs: no Gardisil: yes, completed: Middle School/Highschool   Hgb: ; Urine: Leuks 1, Blood 2, Trace Protein ; UPT: Negative   Health Maintenance  Topic Date Due  . Chlamydia Screening  09/17/2007  . Pap Smear  09/16/2010  . Tetanus/tdap  09/17/2011  . Influenza Vaccine  05/05/2013    Family History  Problem Relation Age of Onset  . Prostate cancer Maternal Grandfather   . Diabetes Paternal Grandmother     Patient Active Problem List   Diagnosis Date Noted  . Contraception management 07/26/2013    Past Medical History  Diagnosis Date  . Asthma     Past Surgical History  Procedure Laterality Date  . Tooth extraction  10/04/13    x3    Allergies: Apple  Current Outpatient Prescriptions  Medication Sig Dispense Refill  . famotidine (PEPCID) 10 MG tablet Take 10 mg by mouth 2 (two) times daily.       No current facility-administered medications for this visit.    ROS: Pertinent items are noted in HPI.  Exam:    BP 130/76  Pulse 70  Resp 14  Ht 5\' 8"  (1.727 m)  Wt 198 lb (89.812 kg)  BMI 30.11 kg/m2  LMP 11/27/2013 Weight change: @WEIGHTCHANGE @ Last 3 height recordings:  Ht Readings from Last 3 Encounters:  11/27/13 5\' 8"  (1.727 m)  10/08/13 5\' 9"  (1.753 m)  07/26/13 5\' 9"  (1.753 m)   General appearance: alert, cooperative and appears stated age Head: Normocephalic, without  obvious abnormality, atraumatic Neck: no adenopathy, no carotid bruit, no JVD, supple, symmetrical, trachea midline and thyroid not enlarged, symmetric, no tenderness/mass/nodules Lungs: clear to auscultation bilaterally Breasts: normal appearance, no masses or tenderness Heart: regular rate and rhythm, S1, S2 normal, no murmur, click, rub or gallop Abdomen: soft, non-tender; bowel sounds normal; no masses,  no organomegaly Extremities: extremities normal, atraumatic, no cyanosis or edema Skin: Skin color, texture, turgor normal. No rashes or lesions Lymph nodes: Cervical, supraclavicular, and axillary nodes normal. no inguinal nodes palpated Neurologic: Grossly normal   Pelvic: External genitalia:  no lesions              Urethra: normal appearing urethra with no masses, tenderness or lesions              Bartholins and Skenes: normal                 Vagina: normal appearing vagina with normal color and discharge, no lesions, menstrum              Cervix: normal appearance              Pap taken: yes        Bimanual Exam:  Uterus:  uterus is normal size, shape, consistency and nontender  Adnexa:    normal adnexa in size, nontender and no masses                                      Rectovaginal: Deferred                                      Anus:  defer exam  A: well woman Contraceptive management Irregular cycles     P: pap smear with reflex Pt to chart her cycles Pt not interested in contraception.  She states that she does not want to continue to be sexually active counseled on STD prevention, adequate intake of calcium and vitamin D, diet and exercise return annually or prn Discussed STD prevention, regular condom use.     An After Visit Summary was printed and given to the patient.

## 2013-11-27 NOTE — Patient Instructions (Signed)
Chart cycles, flow and amount and return if concerned or long periods of no cycle

## 2013-11-29 LAB — IPS N GONORRHOEA AND CHLAMYDIA BY PCR

## 2013-11-29 LAB — IPS PAP TEST WITH REFLEX TO HPV

## 2013-12-04 ENCOUNTER — Telehealth: Payer: Self-pay | Admitting: *Deleted

## 2013-12-04 MED ORDER — FLUCONAZOLE 150 MG PO TABS: 150.0000 mg | ORAL_TABLET | Freq: Once | ORAL | Status: AC

## 2013-12-04 NOTE — Telephone Encounter (Signed)
Message copied by Lorraine LaxSHAW, Rolonda Pontarelli J on Mon Dec 04, 2013 10:22 AM ------      Message from: Douglass RiversLATHROP, TRACY      Created: Wed Nov 29, 2013  3:47 PM       Inform GC/CTM negative. Pap normal but yeast noted can treat with otc or diflucan 150mg  #1 fi desires rx, recall 2 ------

## 2013-12-04 NOTE — Telephone Encounter (Signed)
Patient notified (see labs).  Encounter closed. 

## 2013-12-04 NOTE — Addendum Note (Signed)
Addended by: Lorraine LaxSHAW, Abimelec Grochowski J on: 12/04/2013 10:49 AM   Modules accepted: Orders

## 2013-12-04 NOTE — Telephone Encounter (Signed)
Left Message To Call Back  

## 2014-05-21 ENCOUNTER — Telehealth: Payer: Self-pay | Admitting: Gynecology

## 2014-05-21 NOTE — Telephone Encounter (Signed)
Patient is having some problems with "gassy" feelings that come and go. Patient is asking for an appointment today if possible.

## 2014-05-21 NOTE — Telephone Encounter (Signed)
Spoke with patient. Patient states that earlier in the summer she was really stressed and was "gassy." "It has calmed down a lot now but I want to get it checked to make sure everything is okay." Patient denies any bowel movement changes and any current problems with GI. Patient is seen with pediatrician for primary care. Advised patient it is best to be seen with primary care for this as they will be able to advised if anything is needed. Advised patient we can make referral to PCP if needed. Patient is agreeable and will see her primary for this first.  Routing to provider for final review. Patient agreeable to disposition. Will close encounter

## 2014-09-03 ENCOUNTER — Telehealth: Payer: Self-pay

## 2014-09-03 NOTE — Telephone Encounter (Signed)
S/W pt and reschedule AEX to 11/30/14 with Ms. Debbi

## 2014-10-18 ENCOUNTER — Telehealth: Payer: Self-pay | Admitting: Nurse Practitioner

## 2014-10-18 NOTE — Telephone Encounter (Signed)
Left message regarding upcoming appointment has been canceled and needs to be rescheduled. °

## 2014-11-28 ENCOUNTER — Ambulatory Visit: Payer: Federal, State, Local not specified - PPO | Admitting: Gynecology

## 2014-11-30 ENCOUNTER — Ambulatory Visit: Payer: Self-pay | Admitting: Nurse Practitioner

## 2016-03-03 ENCOUNTER — Emergency Department (HOSPITAL_COMMUNITY): Payer: Federal, State, Local not specified - PPO

## 2016-03-03 ENCOUNTER — Emergency Department (HOSPITAL_COMMUNITY)
Admission: EM | Admit: 2016-03-03 | Discharge: 2016-03-03 | Disposition: A | Discharge status: Home / Self Care | Payer: Federal, State, Local not specified - PPO | Attending: Emergency Medicine | Admitting: Emergency Medicine

## 2016-03-03 ENCOUNTER — Encounter (HOSPITAL_COMMUNITY): Payer: Self-pay | Admitting: Emergency Medicine

## 2016-03-03 DIAGNOSIS — J45901 Unspecified asthma with (acute) exacerbation: Secondary | ICD-10-CM | POA: Diagnosis not present

## 2016-03-03 DIAGNOSIS — R000 Tachycardia, unspecified: Secondary | ICD-10-CM | POA: Diagnosis not present

## 2016-03-03 DIAGNOSIS — R0602 Shortness of breath: Secondary | ICD-10-CM | POA: Diagnosis present

## 2016-03-03 DIAGNOSIS — Z79899 Other long term (current) drug therapy: Secondary | ICD-10-CM | POA: Insufficient documentation

## 2016-03-03 DIAGNOSIS — J159 Unspecified bacterial pneumonia: Secondary | ICD-10-CM | POA: Insufficient documentation

## 2016-03-03 DIAGNOSIS — J189 Pneumonia, unspecified organism: Secondary | ICD-10-CM

## 2016-03-03 LAB — CBC WITH DIFFERENTIAL/PLATELET
BASOS ABS: 0 10*3/uL (ref 0.0–0.1)
BASOS PCT: 0 %
EOS ABS: 0 10*3/uL (ref 0.0–0.7)
Eosinophils Relative: 0 %
HCT: 39.8 % (ref 36.0–46.0)
HEMOGLOBIN: 12.4 g/dL (ref 12.0–15.0)
Lymphocytes Relative: 18 %
Lymphs Abs: 1.7 10*3/uL (ref 0.7–4.0)
MCH: 24.2 pg — ABNORMAL LOW (ref 26.0–34.0)
MCHC: 31.2 g/dL (ref 30.0–36.0)
MCV: 77.6 fL — ABNORMAL LOW (ref 78.0–100.0)
Monocytes Absolute: 0.9 10*3/uL (ref 0.1–1.0)
Monocytes Relative: 9 %
NEUTROS ABS: 6.7 10*3/uL (ref 1.7–7.7)
NEUTROS PCT: 73 %
Platelets: 266 10*3/uL (ref 150–400)
RBC: 5.13 MIL/uL — AB (ref 3.87–5.11)
RDW: 12.7 % (ref 11.5–15.5)
WBC: 9.4 10*3/uL (ref 4.0–10.5)

## 2016-03-03 LAB — I-STAT CHEM 8, ED
BUN: 7 mg/dL (ref 6–20)
CHLORIDE: 99 mmol/L — AB (ref 101–111)
Calcium, Ion: 1.11 mmol/L — ABNORMAL LOW (ref 1.12–1.23)
Creatinine, Ser: 0.7 mg/dL (ref 0.44–1.00)
Glucose, Bld: 100 mg/dL — ABNORMAL HIGH (ref 65–99)
HEMATOCRIT: 42 % (ref 36.0–46.0)
Hemoglobin: 14.3 g/dL (ref 12.0–15.0)
POTASSIUM: 3.1 mmol/L — AB (ref 3.5–5.1)
SODIUM: 140 mmol/L (ref 135–145)
TCO2: 26 mmol/L (ref 0–100)

## 2016-03-03 LAB — I-STAT BETA HCG BLOOD, ED (MC, WL, AP ONLY): I-stat hCG, quantitative: <5 m[IU]/mL (ref <5)

## 2016-03-03 LAB — I-STAT TROPONIN, ED: TROPONIN I, POC: 0 ng/mL (ref 0.00–0.08)

## 2016-03-03 LAB — D-DIMER, QUANTITATIVE: D-Dimer, Quant: 1 ug/mL-FEU — ABNORMAL HIGH (ref 0.00–0.50)

## 2016-03-03 MED ORDER — PREDNISONE 20 MG PO TABS
60.0000 mg | ORAL_TABLET | Freq: Once | ORAL | Status: AC
  Administered 2016-03-03: 60 mg via ORAL
  Filled 2016-03-03: qty 3

## 2016-03-03 MED ORDER — ALBUTEROL SULFATE HFA 108 (90 BASE) MCG/ACT IN AERS
2.0000 | INHALATION_SPRAY | Freq: Once | RESPIRATORY_TRACT | Status: AC
  Administered 2016-03-03: 2 via RESPIRATORY_TRACT
  Filled 2016-03-03: qty 6.7

## 2016-03-03 MED ORDER — IOPAMIDOL (ISOVUE-370) INJECTION 76%
INTRAVENOUS | Status: AC
  Administered 2016-03-03: 100 mL
  Filled 2016-03-03: qty 100

## 2016-03-03 MED ORDER — AZITHROMYCIN 250 MG PO TABS: 250.0000 mg | ORAL_TABLET | Freq: Every day | ORAL | Status: DC

## 2016-03-03 MED ORDER — IPRATROPIUM-ALBUTEROL 0.5-2.5 (3) MG/3ML IN SOLN
3.0000 mL | Freq: Once | RESPIRATORY_TRACT | Status: AC
  Administered 2016-03-03: 3 mL via RESPIRATORY_TRACT

## 2016-03-03 MED ORDER — PREDNISONE 20 MG PO TABS: 40.0000 mg | ORAL_TABLET | Freq: Every day | ORAL | Status: DC

## 2016-03-03 MED ORDER — IPRATROPIUM-ALBUTEROL 0.5-2.5 (3) MG/3ML IN SOLN
RESPIRATORY_TRACT | Status: AC
  Filled 2016-03-03: qty 3

## 2016-03-03 MED ORDER — IPRATROPIUM-ALBUTEROL 0.5-2.5 (3) MG/3ML IN SOLN
3.0000 mL | Freq: Once | RESPIRATORY_TRACT | Status: AC
  Administered 2016-03-03: 3 mL via RESPIRATORY_TRACT
  Filled 2016-03-03: qty 3

## 2016-03-03 NOTE — Discharge Instructions (Signed)
Your CT scan showed pneumonia. Take prednisone as prescribed until all gone, next dose tomorrow. Take inhaler 2 puffs every 4 hours. Take Zithromax as prescribed until all gone. Please follow-up with your doctor if not improving of return to emergency department if worsening.   Community-Acquired Pneumonia, Adult Pneumonia is an infection of the lungs. There are different types of pneumonia. One type can develop while a person is in a hospital. A different type, called community-acquired pneumonia, develops in people who are not, or have not recently been, in the hospital or other health care facility.  CAUSES Pneumonia may be caused by bacteria, viruses, or funguses. Community-acquired pneumonia is often caused by Streptococcus pneumonia bacteria. These bacteria are often passed from one person to another by breathing in droplets from the cough or sneeze of an infected person. RISK FACTORS The condition is more likely to develop in:  People who havechronic diseases, such as chronic obstructive pulmonary disease (COPD), asthma, congestive heart failure, cystic fibrosis, diabetes, or kidney disease.  People who haveearly-stage or late-stage HIV.  People who havesickle cell disease.  People who havehad their spleen removed (splenectomy).  People who havepoor Administratordental hygiene.  People who havemedical conditions that increase the risk of breathing in (aspirating) secretions their own mouth and nose.   People who havea weakened immune system (immunocompromised).  People who smoke.  People whotravel to areas where pneumonia-causing germs commonly exist.  People whoare around animal habitats or animals that have pneumonia-causing germs, including birds, bats, rabbits, cats, and farm animals. SYMPTOMS Symptoms of this condition include:  Adry cough.  A wet (productive) cough.  Fever.  Sweating.  Chest pain, especially when breathing deeply or coughing.  Rapid breathing or  difficulty breathing.  Shortness of breath.  Shaking chills.  Fatigue.  Muscle aches. DIAGNOSIS Your health care provider will take a medical history and perform a physical exam. You may also have other tests, including:  Imaging studies of your chest, including X-rays.  Tests to check your blood oxygen level and other blood gases.  Other tests on blood, mucus (sputum), fluid around your lungs (pleural fluid), and urine. If your pneumonia is severe, other tests may be done to identify the specific cause of your illness. TREATMENT The type of treatment that you receive depends on many factors, such as the cause of your pneumonia, the medicines you take, and other medical conditions that you have. For most adults, treatment and recovery from pneumonia may occur at home. In some cases, treatment must happen in a hospital. Treatment may include:  Antibiotic medicines, if the pneumonia was caused by bacteria.  Antiviral medicines, if the pneumonia was caused by a virus.  Medicines that are given by mouth or through an IV tube.  Oxygen.  Respiratory therapy. Although rare, treating severe pneumonia may include:  Mechanical ventilation. This is done if you are not breathing well on your own and you cannot maintain a safe blood oxygen level.  Thoracentesis. This procedureremoves fluid around one lung or both lungs to help you breathe better. HOME CARE INSTRUCTIONS  Take over-the-counter and prescription medicines only as told by your health care provider.  Only takecough medicine if you are losing sleep. Understand that cough medicine can prevent your body's natural ability to remove mucus from your lungs.  If you were prescribed an antibiotic medicine, take it as told by your health care provider. Do not stop taking the antibiotic even if you start to feel better.  Sleep in a semi-upright position  at night. Try sleeping in a reclining chair, or place a few pillows under your  head.  Do not use tobacco products, including cigarettes, chewing tobacco, and e-cigarettes. If you need help quitting, ask your health care provider.  Drink enough water to keep your urine clear or pale yellow. This will help to thin out mucus secretions in your lungs. PREVENTION There are ways that you can decrease your risk of developing community-acquired pneumonia. Consider getting a pneumococcal vaccine if:  You are older than 24 years of age.  You are older than 24 years of age and are undergoing cancer treatment, have chronic lung disease, or have other medical conditions that affect your immune system. Ask your health care provider if this applies to you. There are different types and schedules of pneumococcal vaccines. Ask your health care provider which vaccination option is best for you. You may also prevent community-acquired pneumonia if you take these actions:  Get an influenza vaccine every year. Ask your health care provider which type of influenza vaccine is best for you.  Go to the dentist on a regular basis.  Wash your hands often. Use hand sanitizer if soap and water are not available. SEEK MEDICAL CARE IF:  You have a fever.  You are losing sleep because you cannot control your cough with cough medicine. SEEK IMMEDIATE MEDICAL CARE IF:  You have worsening shortness of breath.  You have increased chest pain.  Your sickness becomes worse, especially if you are an older adult or have a weakened immune system.  You cough up blood.   This information is not intended to replace advice given to you by your health care provider. Make sure you discuss any questions you have with your health care provider.   Document Released: 09/21/2005 Document Revised: 06/12/2015 Document Reviewed: 01/16/2015 Elsevier Interactive Patient Education [employer]

## 2016-03-03 NOTE — ED Provider Notes (Signed)
CSN: [id number]     Arrival date & time 03/03/16  1203 History  By signing my name below, I, Bridgette Habermann, attest that this documentation has been prepared under the direction and in the presence of Avner Stroder, PA-C. Electronically Signed: Bridgette Habermann, ED Scribe. 03/03/2016. 12:36 PM.   Chief Complaint  Patient presents with  . Asthma   The history is provided by the patient. No language interpreter was used.    HPI Comments: Isabel Scott is a 25 y.o. female with a h/o asthma who presents to the Emergency Department complaining of SOB onset three days ago that worsened this morning. Patient also has associated dry coughing and wheezing. Pt describes her SOB as non-painful and difficulty taking in a full breath. Per pt, she went to the beach 3 days ago prior to the onset of her symptoms. She was seen at an urgent care today and was referred to here for further evaluation. She was given a Duoneb treatment PTA and reports little to no relief of symptoms. Patient states she had asthma as a child and has not had flare-ups since. Patient does not smoke and is not on birth control. She denies fever, runny nose, chest pain, leg swelling and sore throat.   Past Medical History  Diagnosis Date  . Asthma    Past Surgical History  Procedure Laterality Date  . Tooth extraction  10/04/13    x3   Family History  Problem Relation Age of Onset  . Prostate cancer Maternal Grandfather   . Diabetes Paternal Grandmother    Social History  Substance Use Topics  . Smoking status: Never Smoker   . Smokeless tobacco: None  . Alcohol Use: No   OB History    Gravida Para Term Preterm AB TAB SAB Ectopic Multiple Living       Review of Systems  Constitutional: Negative for fever.  HENT: Negative for congestion, rhinorrhea and sore throat.   Respiratory: Positive for cough, shortness of breath and wheezing.   Cardiovascular: Negative for chest pain and leg swelling.       Allergies  Apple  Home Medications   Prior to Admission medications   Medication Sig Start Date End Date Taking? Authorizing Provider  famotidine (PEPCID) 10 MG tablet Take 10 mg by mouth 2 (two) times daily.    Historical Provider, MD  fluconazole (DIFLUCAN) 150 MG tablet Take 1 tablet (150 mg total) by mouth once. 12/04/13   Douglass Rivers, MD   BP 114/72 mmHg  Pulse 105  Temp(Src) 98.7 F (37.1 C) (Oral)  Resp 18  Wt 226 lb 6.4 oz (102.694 kg)  SpO2 98% Physical Exam  Constitutional: She is oriented to person, place, and time. She appears well-developed and well-nourished.  HENT:  Head: Normocephalic.  Mouth/Throat: Oropharynx is clear and moist.  Eyes: Conjunctivae are normal.  Neck: Neck supple.  Cardiovascular: Regular rhythm and normal heart sounds.   Tachycardic  Pulmonary/Chest: Effort normal and breath sounds normal. No respiratory distress. She has no wheezes. She has no rales.  Abdominal: She exhibits no distension.  Musculoskeletal: Normal range of motion.  Neurological: She is alert and oriented to person, place, and time.  Skin: Skin is warm and dry.  Psychiatric: She has a normal mood and affect. Her behavior is normal.  Nursing note and vitals reviewed.   ED Course  Procedures (including critical care time) DIAGNOSTIC STUDIES: Oxygen Saturation is 98% on RA,  normal by my interpretation.    COORDINATION OF CARE: 12:33 PM Discussed treatment plan with pt at bedside which includes blood tests and chest x-ray and pt agreed to plan.  Labs Review Labs Reviewed  CBC WITH DIFFERENTIAL/PLATELET - Abnormal; Notable for the following:    RBC 5.13 (*)    MCV 77.6 (*)    MCH 24.2 (*)    All other components within normal limits  D-DIMER, QUANTITATIVE (NOT AT [facility]) - Abnormal; Notable for the following:    D-Dimer, Quant 1.00 (*)    All other components within normal limits  I-STAT CHEM 8, ED - Abnormal; Notable for the following:    Potassium 3.1 (*)     Chloride 99 (*)    Glucose, Bld 100 (*)    Calcium, Ion 1.11 (*)    All other components within normal limits  I-STAT BETA HCG BLOOD, ED ([facility], [facility], [facility] ONLY)  I-STAT TROPOININ, ED  I-STAT TROPOININ, ED    Imaging Review Ct Angio Chest Pe W/cm &/or Wo Cm  03/03/2016  CLINICAL DATA:  Acute shortness of breath. EXAM: CT ANGIOGRAPHY CHEST WITH CONTRAST TECHNIQUE: Multidetector CT imaging of the chest was performed using the standard protocol during bolus administration of intravenous contrast. Multiplanar CT image reconstructions and MIPs were obtained to evaluate the vascular anatomy. CONTRAST:  100 mL of Isovue 370 intravenously. COMPARISON:  Chest radiograph of October 08, 2013. FINDINGS: No pneumothorax or pleural effusion is noted. Left upper lobe pneumonia is noted. Right lung is clear. Mild left hilar adenopathy is noted which most likely is inflammatory in etiology. There is no evidence of pulmonary embolus. There is no evidence of thoracic aortic dissection or aneurysm. Visualized portion of upper abdomen is unremarkable. No significant osseous abnormality is noted. Review of the MIP images confirms the above findings. IMPRESSION: No evidence of pulmonary embolus. Left upper lobe pneumonia is noted with mild left hilar adenopathy which most likely is inflammatory in etiology. Electronically Signed   By: Lupita Raider, M.D.   On: 03/03/2016 15:11   I have personally reviewed and evaluated these images and lab results as part of my medical decision-making.   EKG Interpretation   Date/Time:  Tuesday Mar 03 2016 12:42:38 EDT Ventricular Rate:  118 PR Interval:  134 QRS Duration: 74 QT Interval:  334 QTC Calculation: 468 R Axis:   85 Text Interpretation:  Sinus tachycardia Right atrial enlargement  Borderline ECG rate is faster, otherwise no significant change since 2015  Confirmed by GOLDSTON MD, SCOTT [id number]) on 03/03/2016 12:55:22 PM      MDM   Final diagnoses:  CAP (community  acquired pneumonia)  Shortness of breath   Patient emergency department with cough and shortness of breath. Acute onset 2 days ago. Does have history of asthma but states she has not had problems with asthma since she was little. Patient already received 2 breathing treatments, one in urgent care, one here. She states it did not really help. No wheezing heard on my exam, lungs are clear. Will get a chest x-ray and blood work, I'm concerned about possible PE given tachycardia and shortness of breath. She however has no risk factors for PE.  Chest x-ray not done yet, lab work showing elevated d-dimer of 1. Will get CT angiogram of the chest.  3:19 PM CT is negative for PE, however shows left upper lobe pneumonia. IV CT myself. Patient is afebrile, nontoxic appearing other than mild tachycardia which could be also attributed to  her multiple neb treatments. She is stable for discharge home. Will start on Zithromax, continue prednisone for 5 more days, continue inhaler. Follow-up for recheck or return if worsening. Patient voiced understanding.  Filed Vitals:   03/03/16 1208  BP: 114/72  Pulse: 105  Temp: 98.7 F (37.1 C)  TempSrc: Oral  Resp: 18  Weight: 102.694 kg  SpO2: 98%   I personally performed the services described in this documentation, which was scribed in my presence. The recorded information has been reviewed and is accurate.   Jaynie Crumbleatyana Natalija Mavis, PA-C 03/03/16 1524  Pricilla LovelessScott Goldston, MD 03/04/16 [phone]

## 2016-03-03 NOTE — ED Notes (Signed)
Pt sts increased trouble of breathing; pt sts hx of asthma and coughing

## 2016-03-03 NOTE — ED Provider Notes (Signed)
Angiocath insertion Performed by: Pricilla LovelessGOLDSTON, Cornell Bourbon T  Consent: Verbal consent obtained. Risks and benefits: risks, benefits and alternatives were discussed Time out: Immediately prior to procedure a "time out" was called to verify the correct patient, procedure, equipment, support staff and site/side marked as required.  Preparation: Patient was prepped and draped in the usual sterile fashion.  Vein Location: right basilic  Ultrasound Guided  Gauge: 20  Normal blood return and flush without difficulty Patient tolerance: Patient tolerated the procedure well with no immediate complications.     Pricilla LovelessScott Aariel Ems, MD 03/03/16 [phone]

## 2016-03-03 NOTE — ED Notes (Signed)
IV attempt x2 without success.

## 2018-02-06 IMAGING — CT CT ANGIO CHEST
2 of 8 series · 19 of 46 positions shown · IV contrast (OMNI)
Comparison: Chest radiograph of October 08, 2013.

CLINICAL DATA: Acute shortness of breath.

EXAM:
CT ANGIOGRAPHY CHEST WITH CONTRAST
TECHNIQUE: Multidetector CT imaging of the chest was performed using the
standard protocol during bolus administration of intravenous
contrast. Multiplanar CT image reconstructions and MIPs were
obtained to evaluate the vascular anatomy.
CONTRAST:  100 mL of Isovue 370 intravenously.

[Series 5: thins · axial · 0.63mm/px · z∈[+1160,+1400]mm · 16 of 264 slices shown]
[im 12/264  lung]
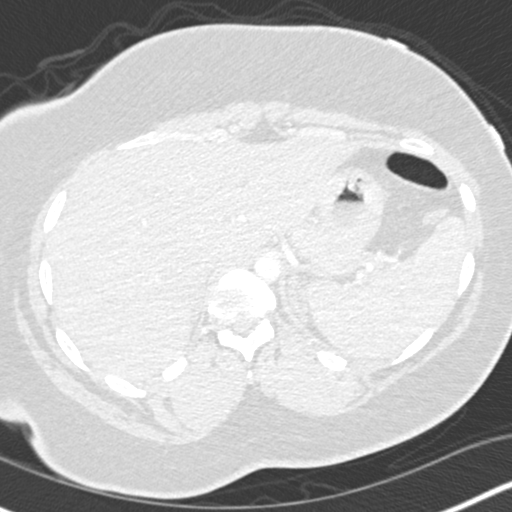
[im 24/264  soft-tissue]
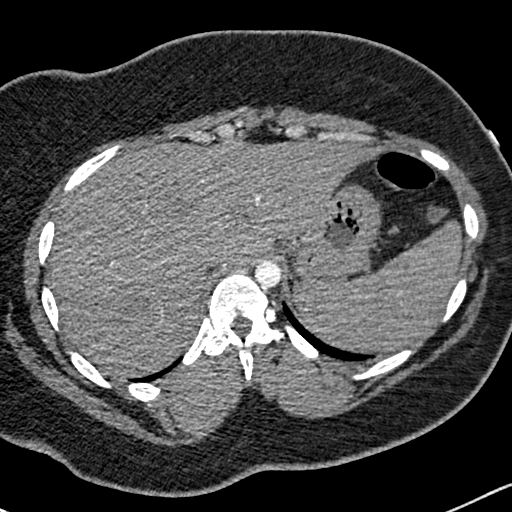
[im 48/264  lung]
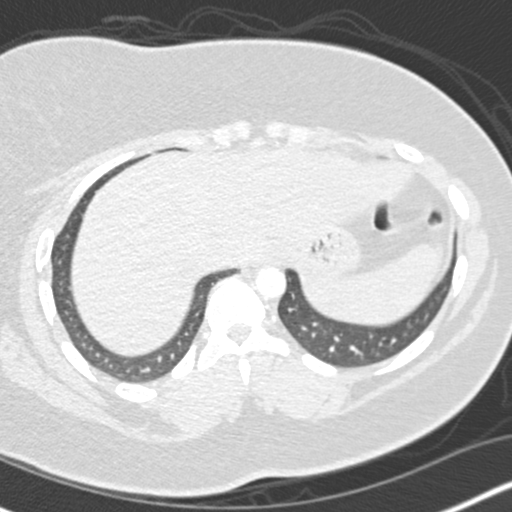
[im 60/264  soft-tissue]
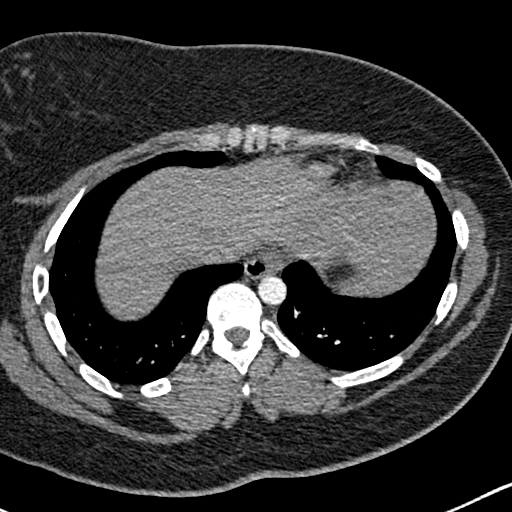
[im 72/264  lung]
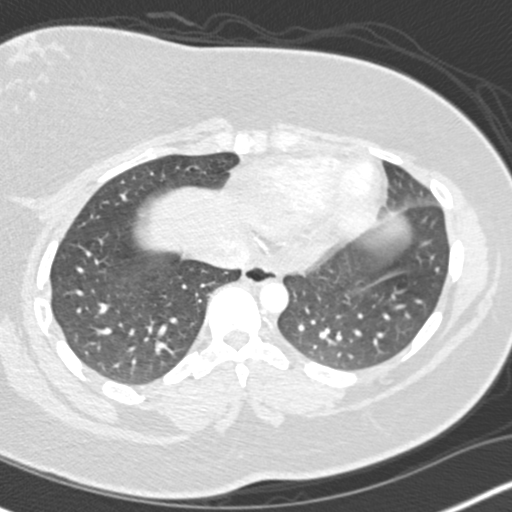
[im 96/264  soft-tissue]
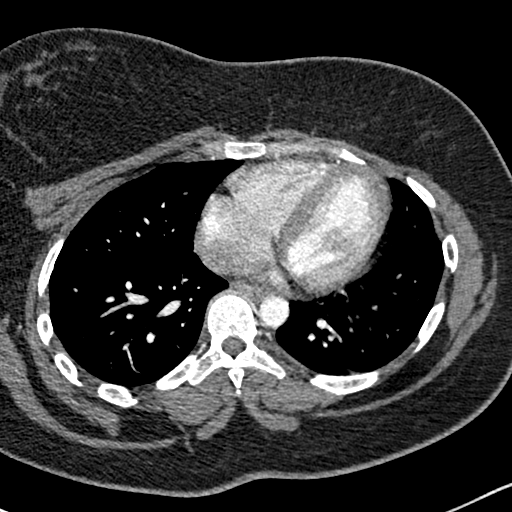
[im 108/264  lung]
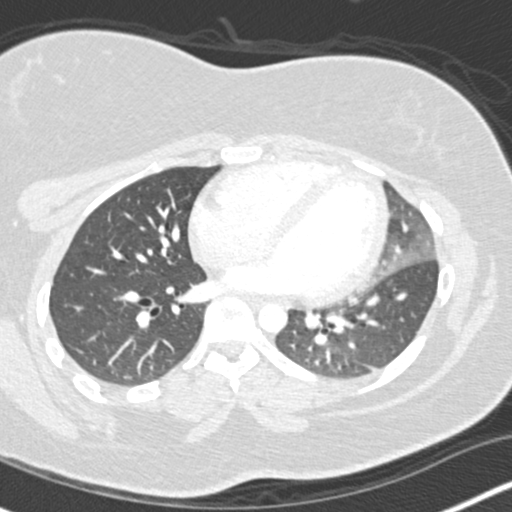
[im 120/264  soft-tissue]
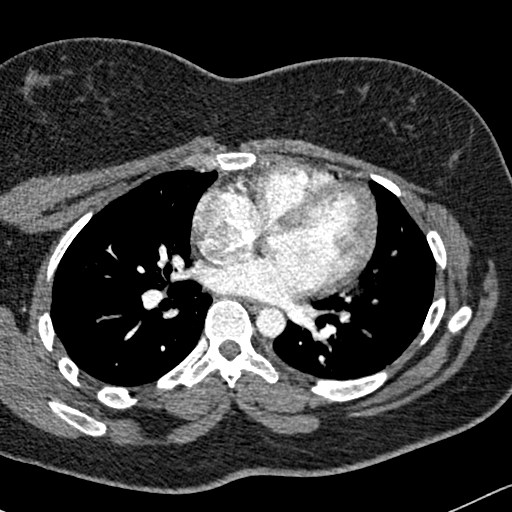
[im 144/264  lung]
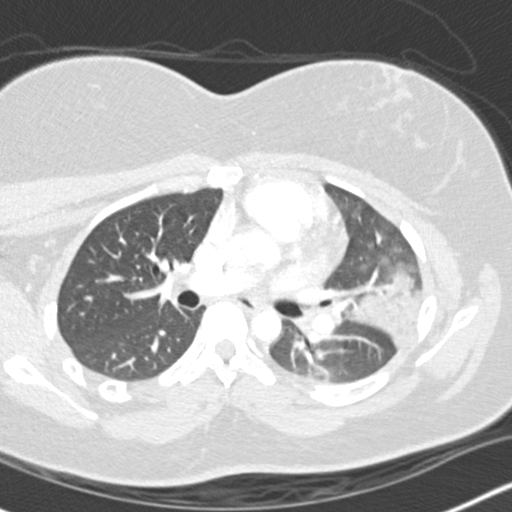
[im 156/264  soft-tissue]
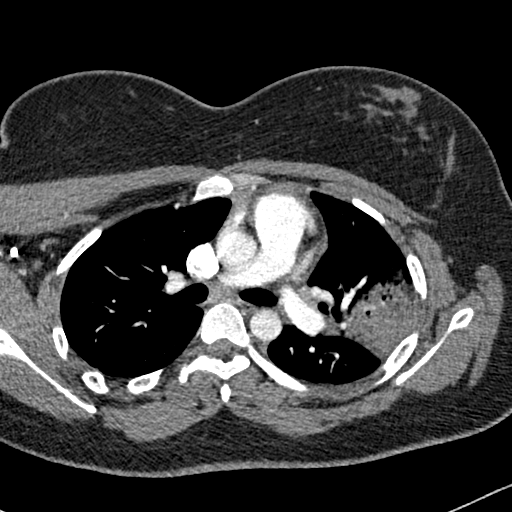
[im 168/264  lung]
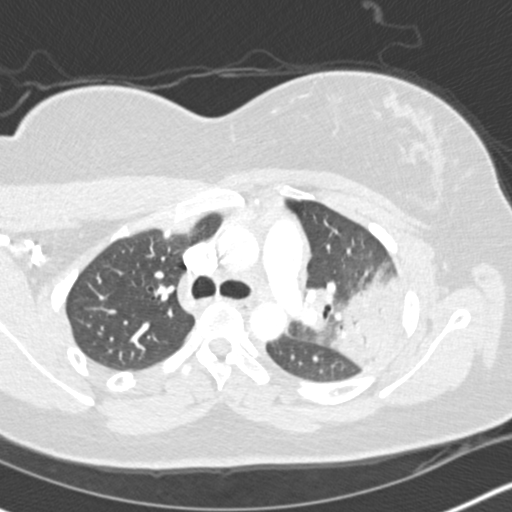
[im 192/264  soft-tissue]
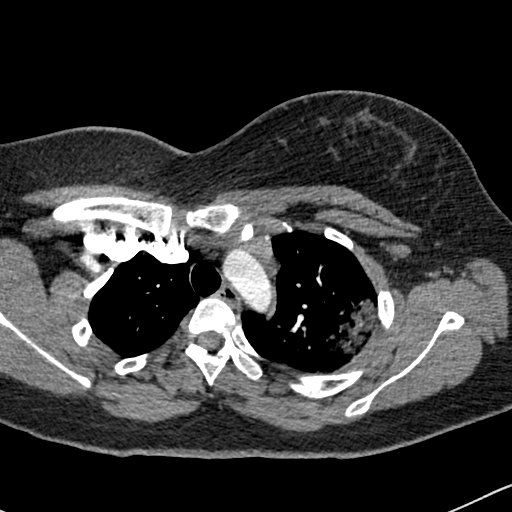
[im 204/264  lung]
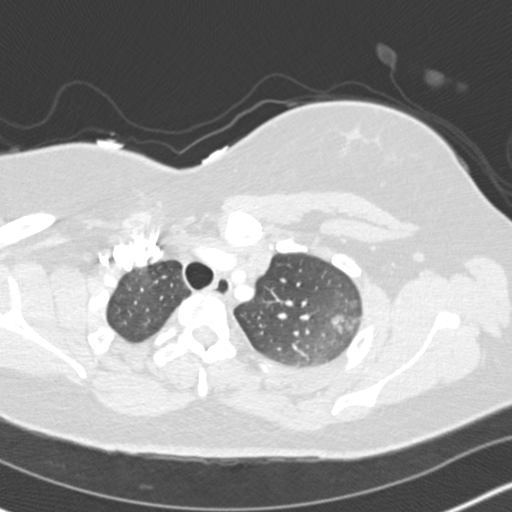
[im 216/264  soft-tissue]
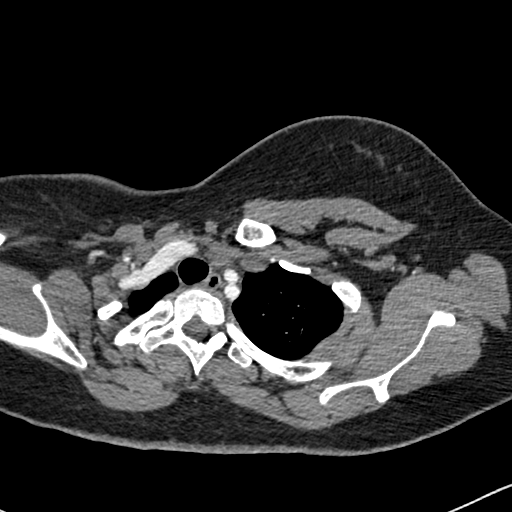
[im 240/264  lung]
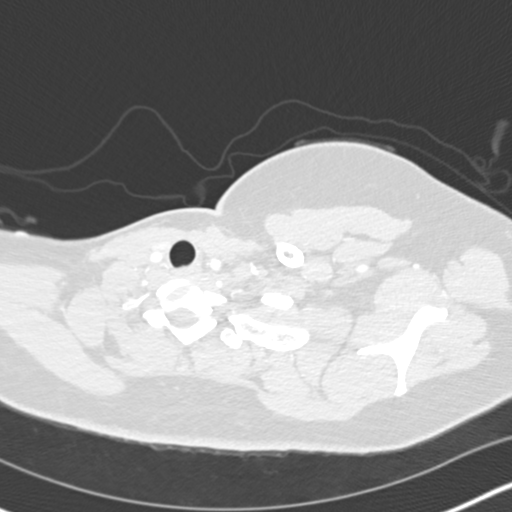
[im 252/264  soft-tissue]
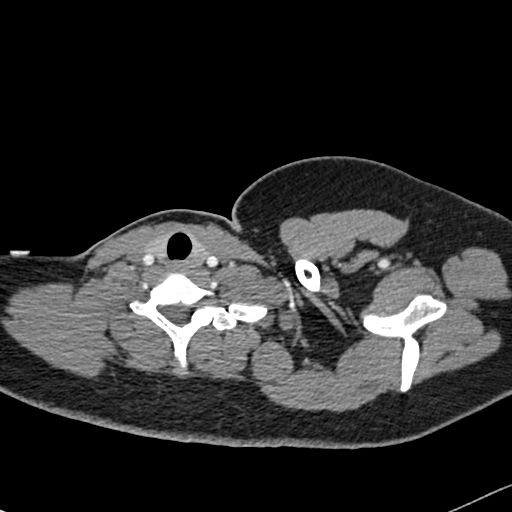

[Series 7: coronal mpr · coronal · 0.51mm/px · 3 of 109 slices shown]
[im 28/109  soft-tissue]
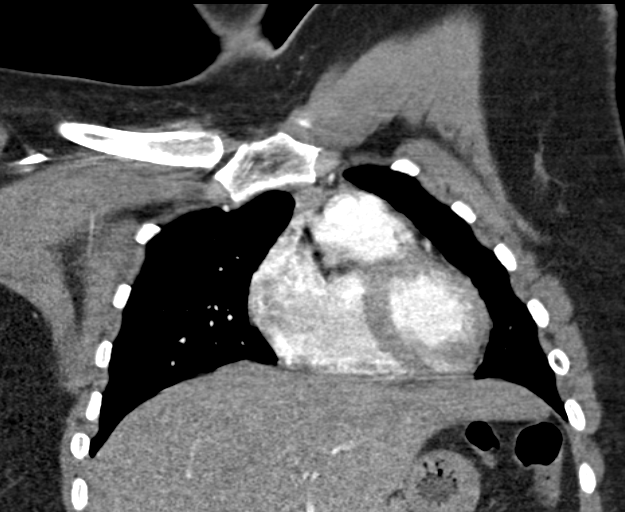
[im 55/109  soft-tissue]
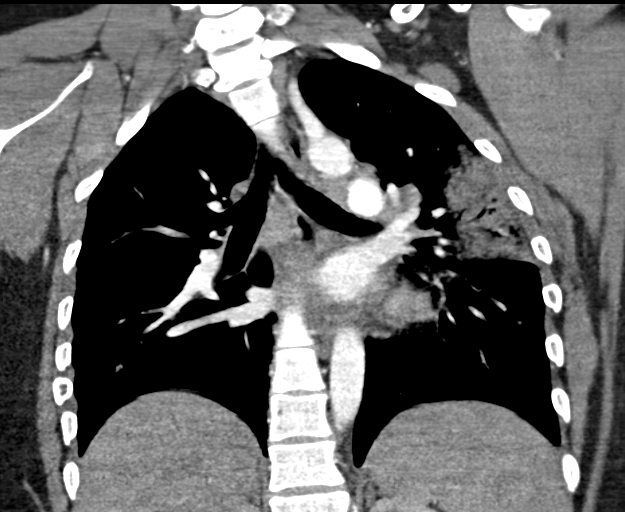
[im 82/109  soft-tissue]
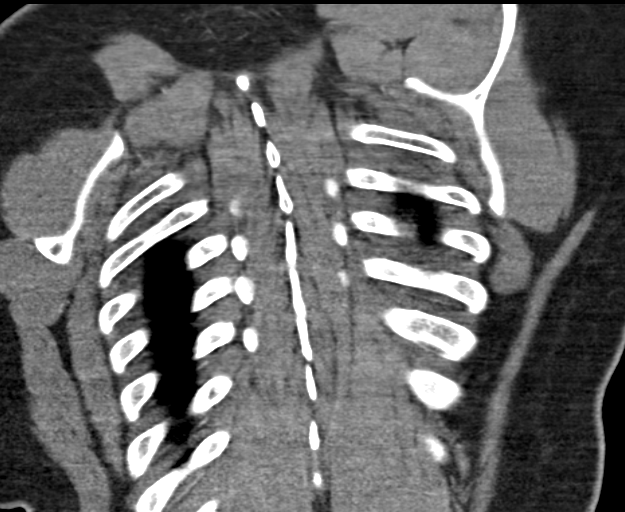

[19 of 46 positions shown; findings below may reference images not displayed]

FINDINGS: No pneumothorax or pleural effusion is noted. Left upper lobe
pneumonia is noted. Right lung is clear. Mild left hilar adenopathy
is noted which most likely is inflammatory in etiology. There is no
evidence of pulmonary embolus. There is no evidence of thoracic
aortic dissection or aneurysm. Visualized portion of upper abdomen
is unremarkable. No significant osseous abnormality is noted.

Review of the MIP images confirms the above findings.
IMPRESSION: No evidence of pulmonary embolus.

Left upper lobe pneumonia is noted with mild left hilar adenopathy
which most likely is inflammatory in etiology.

## 2018-12-13 ENCOUNTER — Encounter (HOSPITAL_COMMUNITY): Payer: Self-pay | Admitting: Emergency Medicine

## 2018-12-13 ENCOUNTER — Other Ambulatory Visit: Payer: Self-pay

## 2018-12-13 ENCOUNTER — Emergency Department (HOSPITAL_COMMUNITY)
Admission: EM | Admit: 2018-12-13 | Discharge: 2018-12-13 | Disposition: A | Discharge status: Home / Self Care | Payer: Self-pay | Attending: Emergency Medicine | Admitting: Emergency Medicine

## 2018-12-13 ENCOUNTER — Emergency Department (HOSPITAL_COMMUNITY): Payer: Self-pay

## 2018-12-13 DIAGNOSIS — J45909 Unspecified asthma, uncomplicated: Secondary | ICD-10-CM | POA: Insufficient documentation

## 2018-12-13 DIAGNOSIS — R0789 Other chest pain: Secondary | ICD-10-CM | POA: Insufficient documentation

## 2018-12-13 LAB — CBC
HEMATOCRIT: 40.3 % (ref 36.0–46.0)
Hemoglobin: 12.3 g/dL (ref 12.0–15.0)
MCH: 24.7 pg — ABNORMAL LOW (ref 26.0–34.0)
MCHC: 30.5 g/dL (ref 30.0–36.0)
MCV: 81.1 fL (ref 80.0–100.0)
NRBC: 0 % (ref 0.0–0.2)
Platelets: 245 10*3/uL (ref 150–400)
RBC: 4.97 MIL/uL (ref 3.87–5.11)
RDW: 13.1 % (ref 11.5–15.5)
WBC: 6 10*3/uL (ref 4.0–10.5)

## 2018-12-13 LAB — BASIC METABOLIC PANEL
Anion gap: 8 (ref 5–15)
BUN: 9 mg/dL (ref 6–20)
CHLORIDE: 103 mmol/L (ref 98–111)
CO2: 28 mmol/L (ref 22–32)
Calcium: 9.4 mg/dL (ref 8.9–10.3)
Creatinine, Ser: 0.82 mg/dL (ref 0.44–1.00)
GFR calc non Af Amer: >60 mL/min (ref >60)
Glucose, Bld: 103 mg/dL — ABNORMAL HIGH (ref 70–99)
Potassium: 3.3 mmol/L — ABNORMAL LOW (ref 3.5–5.1)
Sodium: 139 mmol/L (ref 135–145)

## 2018-12-13 LAB — I-STAT BETA HCG BLOOD, ED (MC, WL, AP ONLY)

## 2018-12-13 LAB — I-STAT TROPONIN, ED: Troponin i, poc: 0 ng/mL (ref 0.00–0.08)

## 2018-12-13 MED ORDER — SODIUM CHLORIDE 0.9% FLUSH: 3.0000 mL | Freq: Once | INTRAVENOUS | Status: DC

## 2018-12-13 NOTE — ED Triage Notes (Signed)
Onset chest pain one week ago resolved and then returned Thursday and constant ever since. Currently pain is 5/10 tender without shortness of breath.

## 2018-12-13 NOTE — Discharge Instructions (Signed)
Take 4 over the counter ibuprofen tablets 3 times a day or 2 over-the-counter naproxen tablets twice a day for pain. Also take tylenol 1000mg(2 extra strength) four times a day.    

## 2018-12-13 NOTE — ED Provider Notes (Signed)
[facility] EMERGENCY DEPARTMENT Provider Note   CSN: [id number] Arrival date & time: 12/13/18  1537    History   Chief Complaint Chief Complaint  Patient presents with  . Chest Pain    HPI Lillyth Sulema Braid is a 28 y.o. female.     28 yo F with a chief complaint of left-sided chest pain.  This is pinpoint been going on for the past few days to a week.  Is off and on and is now a little bit more persistent.  Worse with palpation or moving her arm in a certain direction.  Denies trauma denies cough denies congestion denies fevers.  Denies hemoptysis denies unilateral lower extremity edema denies history of PE or DVT denies recent surgery or immobilization denies estrogen use.  Denies MI denies hypertension hyperlipidemia diabetes smoking or family history of MI.  The history is provided by the patient.  Chest Pain  Pain location:  L chest Pain quality: aching and sharp   Pain radiates to:  Does not radiate Pain severity:  Moderate Onset quality:  Gradual Duration:  1 week Timing:  Intermittent Progression:  Worsening Chronicity:  New Relieved by:  Nothing Worsened by:  Certain positions and movement Ineffective treatments:  None tried Associated symptoms: no dizziness, no fever, no headache, no nausea, no palpitations, no shortness of breath and no vomiting     Past Medical History:  Diagnosis Date  . Asthma     Patient Active Problem List   Diagnosis Date Noted  . Contraception management 07/26/2013    Past Surgical History:  Procedure Laterality Date  . TOOTH EXTRACTION  10/04/13   x3     OB History    Gravida  0   Para  0   Term  0   Preterm  0   AB  0   Living  0     SAB  0   TAB  0   Ectopic  0   Multiple  0   Live Births               Home Medications    Prior to Admission medications   Medication Sig Start Date End Date Taking? Authorizing Provider  azithromycin (ZITHROMAX) 250 MG tablet Take 1  tablet (250 mg total) by mouth daily. Take first 2 tablets together, then 1 every day until finished. Patient not taking: Reported on 12/13/2018 03/03/16   Jaynie Crumble, PA-C  fluconazole (DIFLUCAN) 150 MG tablet Take 1 tablet (150 mg total) by mouth once. Patient not taking: Reported on 12/13/2018 12/04/13   Douglass Rivers, MD  predniSONE (DELTASONE) 20 MG tablet Take 2 tablets (40 mg total) by mouth daily. Patient not taking: Reported on 12/13/2018 03/03/16   Jaynie Crumble, PA-C    Family History Family History  Problem Relation Age of Onset  . Prostate cancer Maternal Grandfather   . Diabetes Paternal Grandmother     Social History Social History   Tobacco Use  . Smoking status: Never Smoker  Substance Use Topics  . Alcohol use: No  . Drug use: No     Allergies   Avocado   Review of Systems Review of Systems  Constitutional: Negative for chills and fever.  HENT: Negative for congestion and rhinorrhea.   Eyes: Negative for redness and visual disturbance.  Respiratory: Negative for shortness of breath and wheezing.   Cardiovascular: Positive for chest pain. Negative for palpitations.  Gastrointestinal: Negative for nausea and vomiting.  Genitourinary: Negative  for dysuria and urgency.  Musculoskeletal: Negative for arthralgias and myalgias.  Skin: Negative for pallor and wound.  Neurological: Negative for dizziness and headaches.     Physical Exam Updated Vital Signs BP 131/81   Pulse 70   Temp 97.8 F (36.6 C) (Oral)   Resp 16   Ht 5\' 9"  (1.753 m)   Wt 83.2 kg   SpO2 100%   BMI 27.08 kg/m   Physical Exam Vitals signs and nursing note reviewed.  Constitutional:      General: She is not in acute distress.    Appearance: She is well-developed. She is not diaphoretic.  HENT:     Head: Normocephalic and atraumatic.  Eyes:     Pupils: Pupils are equal, round, and reactive to light.  Neck:     Musculoskeletal: Normal range of motion and neck  supple.  Cardiovascular:     Rate and Rhythm: Normal rate and regular rhythm.     Heart sounds: No murmur. No friction rub. No gallop.   Pulmonary:     Effort: Pulmonary effort is normal.     Breath sounds: No wheezing or rales.  Chest:     Chest wall: Tenderness present.     Comments: Palpation of the left chest wall about the middle clavicular line about ribs 3 through 6 reproduces the patient's symptoms. Abdominal:     General: There is no distension.     Palpations: Abdomen is soft.     Tenderness: There is no abdominal tenderness.  Musculoskeletal:        General: No tenderness.  Skin:    General: Skin is warm and dry.  Neurological:     Mental Status: She is alert and oriented to person, place, and time.  Psychiatric:        Behavior: Behavior normal.      ED Treatments / Results  Labs (all labs ordered are listed, but only abnormal results are displayed) Labs Reviewed  BASIC METABOLIC PANEL - Abnormal; Notable for the following components:      Result Value   Potassium 3.3 (*)    Glucose, Bld 103 (*)    All other components within normal limits  CBC - Abnormal; Notable for the following components:   MCH 24.7 (*)    All other components within normal limits  I-STAT TROPONIN, ED  I-STAT BETA HCG BLOOD, ED ([facility], [facility], [facility] ONLY)    EKG EKG Interpretation  Date/Time:  Tuesday December 13 2018 15:48:03 EDT Ventricular Rate:  71 PR Interval:  150 QRS Duration: 76 QT Interval:  382 QTC Calculation: 415 R Axis:   78 Text Interpretation:  Normal sinus rhythm with sinus arrhythmia Normal ECG Since last tracing rate slower Otherwise no significant change Confirmed by Melene Plan [phone]) on 12/13/2018 5:17:58 PM   Radiology Dg Chest 2 View  Result Date: 12/13/2018 CLINICAL DATA:  Mid chest pain. EXAM: CHEST - 2 VIEW COMPARISON:  None. FINDINGS: The heart size and mediastinal contours are within normal limits. Both lungs are clear. The visualized skeletal structures are  unremarkable. IMPRESSION: No active cardiopulmonary disease. Electronically Signed   By: Gerome Sam III M.D   On: 12/13/2018 16:19    Procedures Procedures (including critical care time)  Medications Ordered in ED Medications  sodium chloride flush (NS) 0.9 % injection 3 mL (has no administration in time range)     Initial Impression / Assessment and Plan / ED Course  I have reviewed the triage vital signs and  the nursing notes.  Pertinent labs & imaging results that were available during my care of the patient were reviewed by me and considered in my medical decision making (see chart for details).        28 yo F with a chief complaints of left-sided chest pain this is reproducible on palpation.  She was seen in triage and had a work-up with a negative troponin and EKG with no concerning finding a chest x-ray viewed by me without focal infiltrate or pneumothorax.  Most likely this is chest wall pain based on history and physical.  She is PERC negative, will discharge the patient home and have her follow-up with her PCP.  5:49 PM:  I have discussed the diagnosis/risks/treatment options with the patient and believe the pt to be eligible for discharge home to follow-up with PCP. We also discussed returning to the ED immediately if new or worsening sx occur. We discussed the sx which are most concerning (e.g., sudden worsening pain, fever, inability to tolerate by mouth) that necessitate immediate return. Medications administered to the patient during their visit and any new prescriptions provided to the patient are listed below.  Medications given during this visit Medications  sodium chloride flush (NS) 0.9 % injection 3 mL (has no administration in time range)     The patient appears reasonably screen and/or stabilized for discharge and I doubt any other medical condition or other [facility] requiring further screening, evaluation, or treatment in the ED at this time prior to discharge.      Final Clinical Impressions(s) / ED Diagnoses   Final diagnoses:  Chest wall pain    ED Discharge Orders    None       Melene Plan, DO 12/13/18 1750

## 2020-08-08 ENCOUNTER — Other Ambulatory Visit: Payer: Self-pay

## 2020-08-08 ENCOUNTER — Encounter (HOSPITAL_COMMUNITY): Payer: Self-pay

## 2020-08-08 ENCOUNTER — Ambulatory Visit (HOSPITAL_COMMUNITY)
Admission: EM | Admit: 2020-08-08 | Discharge: 2020-08-08 | Disposition: A | Discharge status: Home / Self Care | Payer: Self-pay | Attending: Family Medicine | Admitting: Family Medicine

## 2020-08-08 DIAGNOSIS — L2089 Other atopic dermatitis: Secondary | ICD-10-CM

## 2020-08-08 MED ORDER — PREDNISONE 20 MG PO TABS: 40.0000 mg | ORAL_TABLET | Freq: Every day | ORAL | 0 refills | Status: DC

## 2020-08-08 MED ORDER — TRIAMCINOLONE ACETONIDE 0.025 % EX OINT: 1.0000 "application " | TOPICAL_OINTMENT | Freq: Three times a day (TID) | CUTANEOUS | 0 refills | Status: DC

## 2020-08-08 NOTE — ED Provider Notes (Signed)
[facility]    CSN: [id number] Arrival date & time: 08/08/20  1007      History   Chief Complaint Chief Complaint  Patient presents with  . Rash    HPI Isabel Scott is a 29 y.o. female.   HPI  Patient presents for evaluation of generalized pruritic rash present x 6 days. Patient has a history atopic dermatitis, which she has not had an exacerbation in quite some time.  Patient reports she has been applying hydrocortisone cream and Benadryl without any improvement of symptoms.  The rash is on her upper extremities, rash is nonblistering just pruritic.  She denies any recent illness or known contact with an known irritant. Past Medical History:  Diagnosis Date  . Asthma     Patient Active Problem List   Diagnosis Date Noted  . Contraception management 07/26/2013    Past Surgical History:  Procedure Laterality Date  . TOOTH EXTRACTION  10/04/13   x3    OB History    Gravida  0   Para  0   Term  0   Preterm  0   AB  0   Living  0     SAB  0   TAB  0   Ectopic  0   Multiple  0   Live Births               Home Medications    Prior to Admission medications   Medication Sig Start Date End Date Taking? Authorizing Provider  azithromycin (ZITHROMAX) 250 MG tablet Take 1 tablet (250 mg total) by mouth daily. Take first 2 tablets together, then 1 every day until finished. Patient not taking: Reported on 12/13/2018 03/03/16   Jaynie Crumble, PA-C  fluconazole (DIFLUCAN) 150 MG tablet Take 1 tablet (150 mg total) by mouth once. Patient not taking: Reported on 12/13/2018 12/04/13   Douglass Rivers, MD  predniSONE (DELTASONE) 20 MG tablet Take 2 tablets (40 mg total) by mouth daily. Patient not taking: Reported on 12/13/2018 03/03/16   Jaynie Crumble, PA-C    Family History Family History  Problem Relation Age of Onset  . Healthy Mother   . Healthy Father   . Prostate cancer Maternal Grandfather   . Diabetes Paternal  Grandmother     Social History Social History   Tobacco Use  . Smoking status: Never Smoker  . Smokeless tobacco: Never Used  Substance Use Topics  . Alcohol use: No  . Drug use: No     Allergies   Avocado   Review of Systems Review of Systems Pertinent negatives listed in HPI  Physical Exam Triage Vital Signs ED Triage Vitals  Enc Vitals Group     BP 08/08/20 1129 115/78     Pulse Rate 08/08/20 1129 87     Resp 08/08/20 1129 18     Temp 08/08/20 1129 99.8 F (37.7 C)     Temp Source 08/08/20 1129 Oral     SpO2 08/08/20 1129 96 %     Weight --      Height --      Head Circumference --      Peak Flow --      Pain Score 08/08/20 1127 0     Pain Loc --      Pain Edu? --      Excl. in GC? --    No data found.  Updated Vital Signs BP 115/78 (BP Location: Right Arm)   Pulse 87  Temp 99.8 F (37.7 C) (Oral)   Resp 18   LMP 07/13/2020 (Approximate)   SpO2 96%   Visual Acuity Right Eye Distance:   Left Eye Distance:   Bilateral Distance:    Right Eye Near:   Left Eye Near:    Bilateral Near:     Physical Exam General appearance: alert, well developed, well nourished, cooperative, no distres Head: Normocephalic, without obvious abnormality, atraumatic Respiratory: Respirations even and unlabored, normal respiratory rate Heart: rate and rhythm normal. No gallop or murmurs noted on exam  Abdomen: BS +, no distention, no rebound tenderness, or no mass Extremities: No gross deformities Skin: Macular papular patchy dry rash present on extremities upper chest and neck Psych: Appropriate mood and affect. Neurologic: Mental status: Alert, oriented to person, place, and time, thought content appropriate.   UC Treatments / Results  Labs (all labs ordered are listed, but only abnormal results are displayed) Labs Reviewed - No data to display  EKG   Radiology No results found.  Procedures Procedures (including critical care time)  Medications  Ordered in UC Medications - No data to display  Initial Impression / Assessment and Plan / UC Course  I have reviewed the triage vital signs and the nursing notes.  Pertinent labs & imaging results that were available during my care of the patient were reviewed by me and considered in my medical decision making (see chart for details).    Appearances consistent with eczema or atopic dermatitis.  Will cover with prednisone and Kenalog cream.  Advised to mix Kenalog cream with petroleum jelly for better distribution and to better moisturize the skin.  Patient verbalized understanding and agreement with plan. Final Clinical Impressions(s) / UC Diagnoses   Final diagnoses:  Flexural atopic dermatitis   Discharge Instructions   None    ED Prescriptions    Medication Sig Dispense Auth. Provider   predniSONE (DELTASONE) 20 MG tablet Take 2 tablets (40 mg total) by mouth daily with breakfast. 10 tablet Bing Neighbors, FNP   triamcinolone (KENALOG) 0.025 % ointment Apply 1 application topically 3 (three) times daily. 454 g Bing Neighbors, FNP     PDMP not reviewed this encounter.   Bing Neighbors, FNP 08/12/20 [phone]

## 2020-08-08 NOTE — ED Triage Notes (Signed)
Pt in with c/o generalized itchy rash on neck, arms, legs, abdomen and back that she noticed last Friday. States that she got the flu vaccine Tuesday and noticed rash Friday that started off as hives on the back of her neck.  Also c/o dizziness and not being able to stand for long periods of time   states that she tried benadryl and hydrocortisone cream with no relief  Denies any past reaction to vaccines

## 2020-11-17 IMAGING — CR CHEST - 2 VIEW
2 series · 2 of 2 positions shown · non-contrast
Comparison: None.

CLINICAL DATA: Mid chest pain.

EXAM:
CHEST - 2 VIEW

[chest pa]
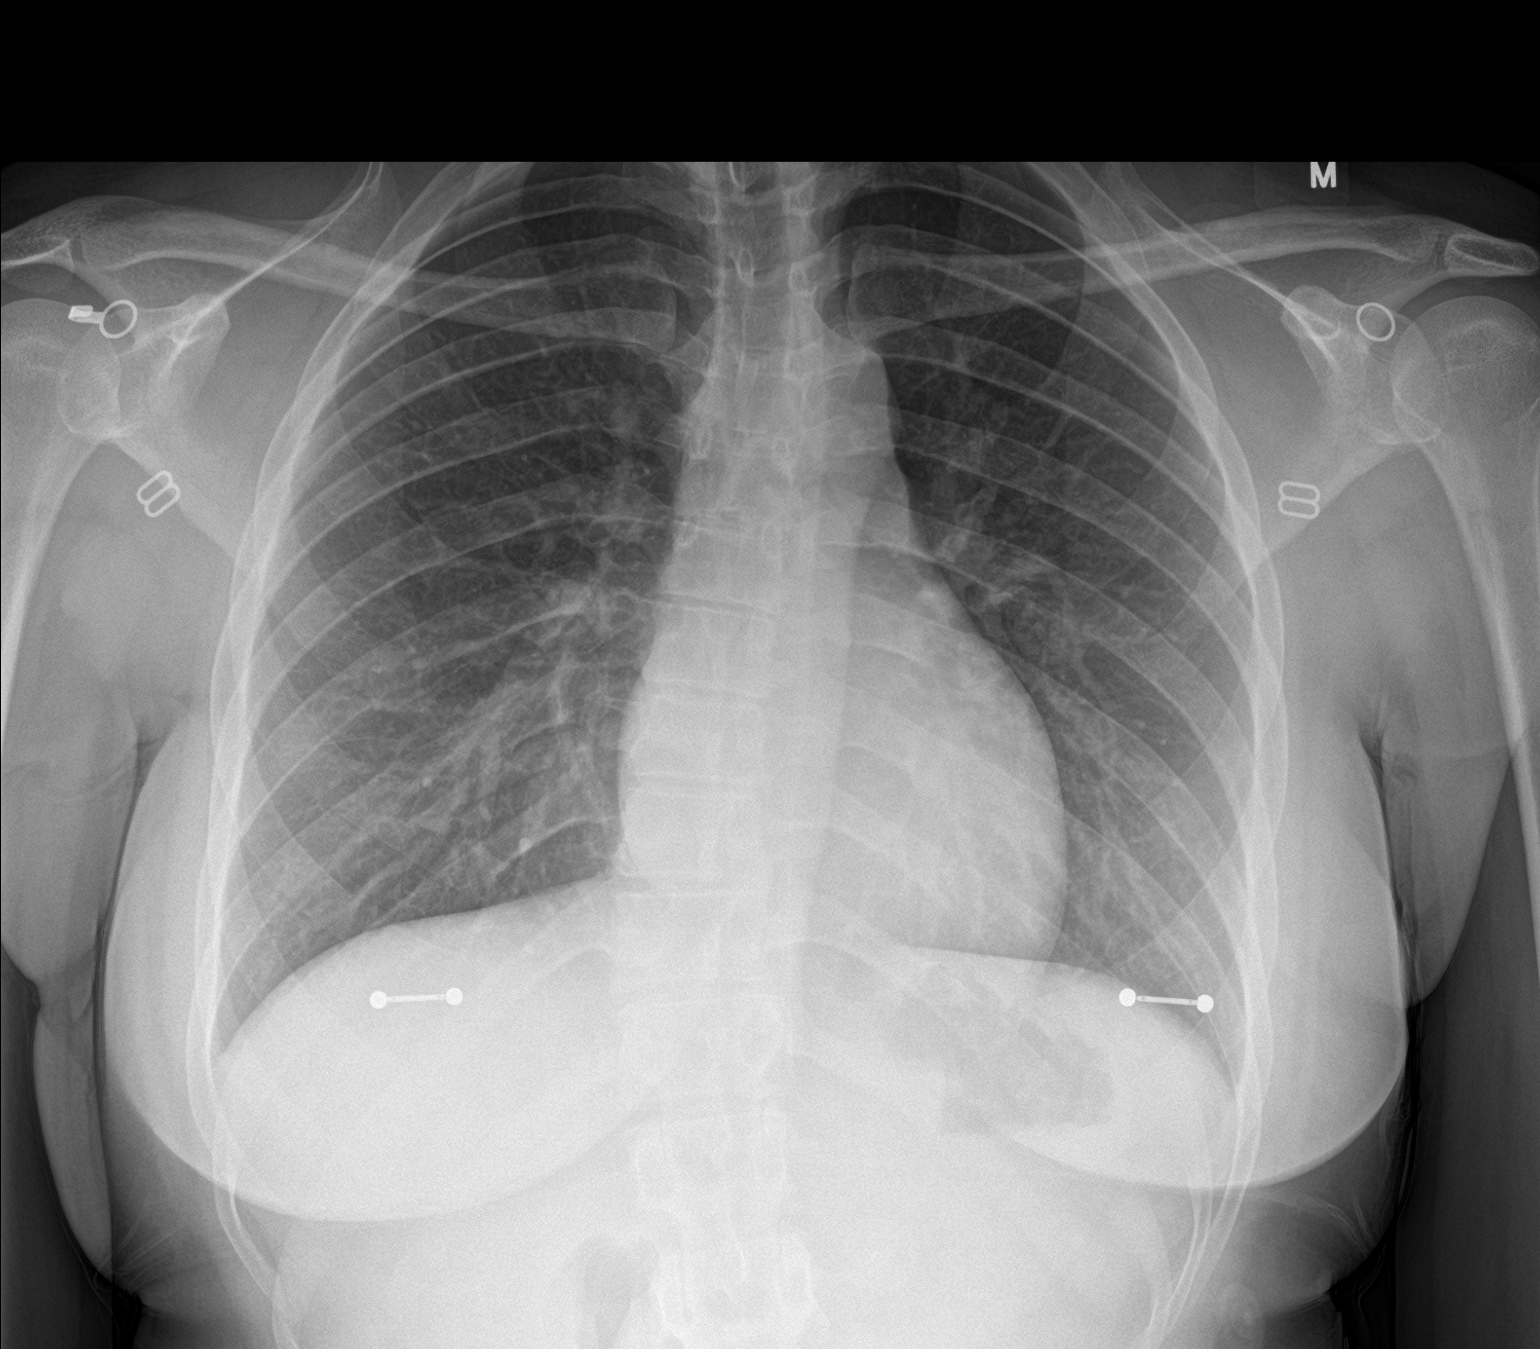

[chest lat]
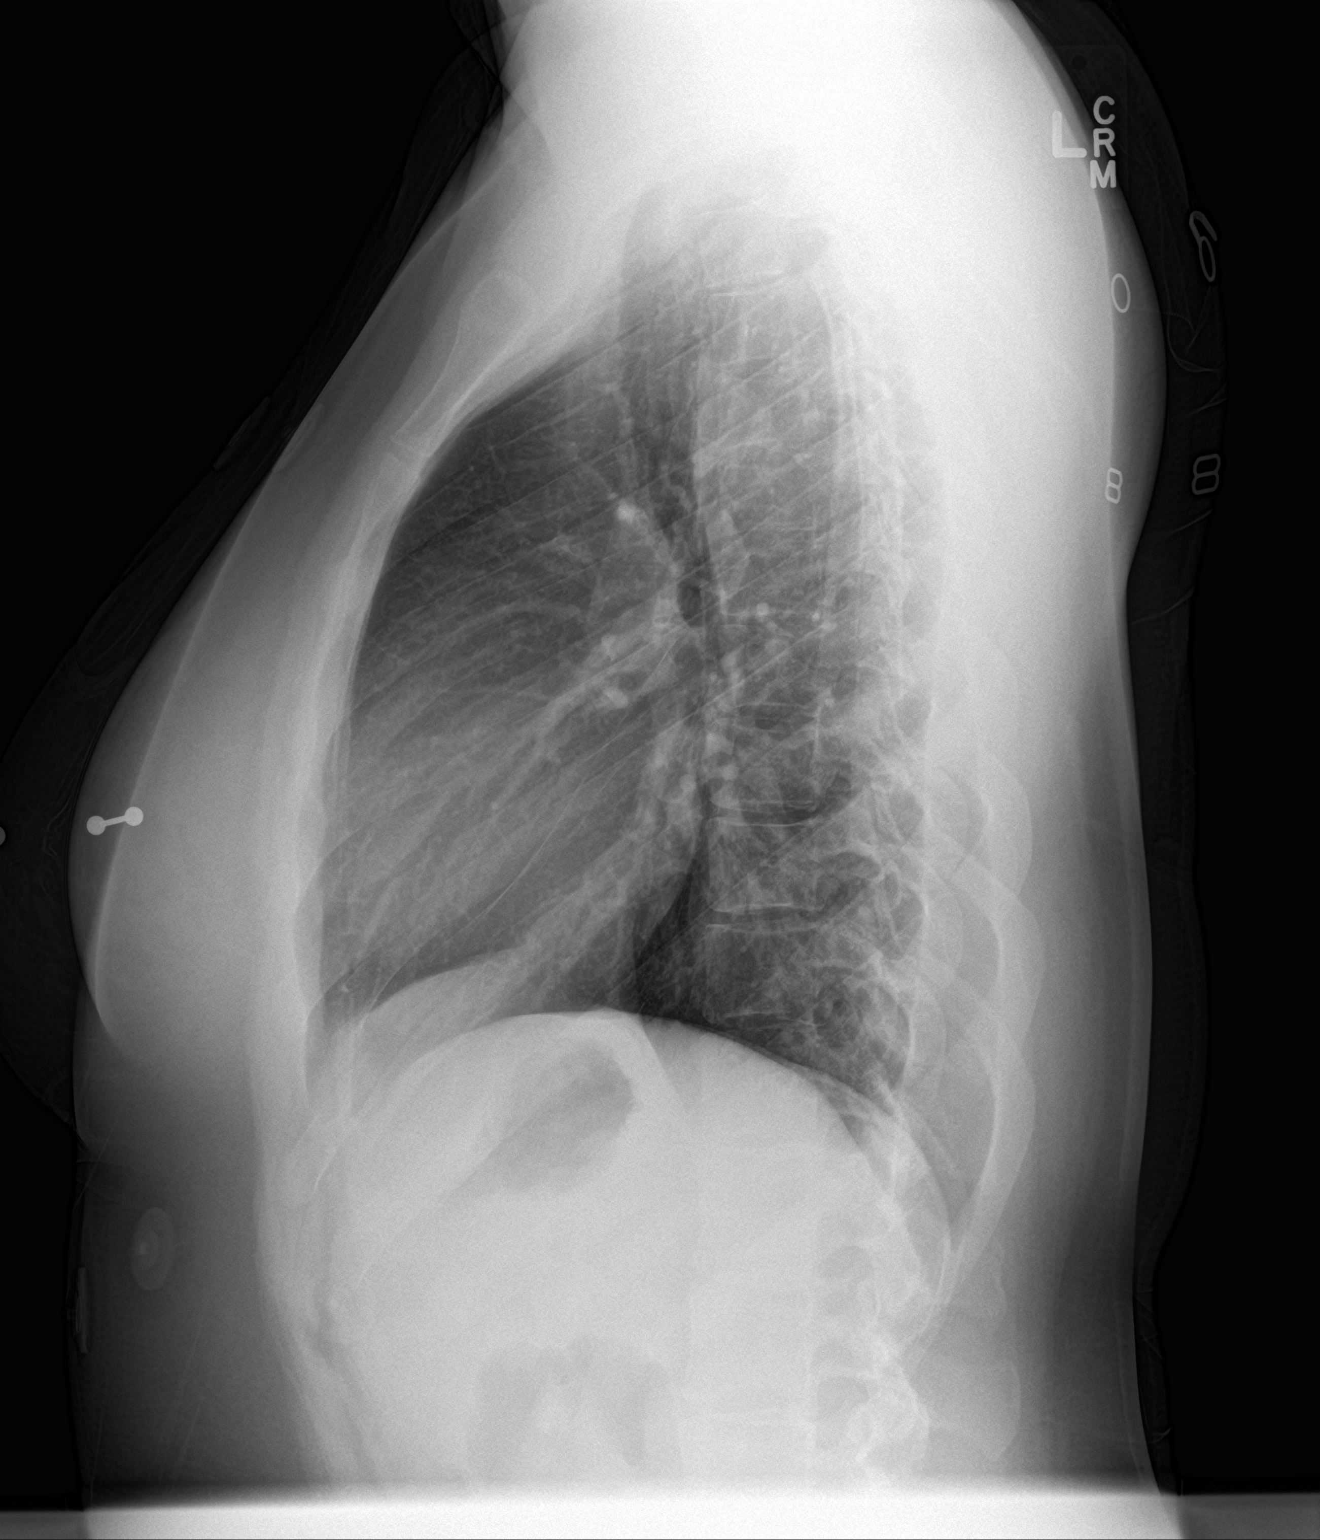

[2 of 2 positions shown; findings below may reference images not displayed]

FINDINGS: The heart size and mediastinal contours are within normal limits.
Both lungs are clear. The visualized skeletal structures are
unremarkable.
IMPRESSION: No active cardiopulmonary disease.

## 2021-01-21 ENCOUNTER — Encounter (HOSPITAL_COMMUNITY): Payer: Self-pay | Admitting: Emergency Medicine

## 2021-01-21 ENCOUNTER — Other Ambulatory Visit: Payer: Self-pay

## 2021-01-21 ENCOUNTER — Telehealth (HOSPITAL_COMMUNITY): Payer: Self-pay | Admitting: Emergency Medicine

## 2021-01-21 ENCOUNTER — Ambulatory Visit (HOSPITAL_COMMUNITY)
Admission: EM | Admit: 2021-01-21 | Discharge: 2021-01-21 | Disposition: A | Discharge status: Home / Self Care | Payer: BC Managed Care – PPO

## 2021-01-21 DIAGNOSIS — Z566 Other physical and mental strain related to work: Secondary | ICD-10-CM | POA: Diagnosis not present

## 2021-01-21 DIAGNOSIS — L309 Dermatitis, unspecified: Secondary | ICD-10-CM

## 2021-01-21 DIAGNOSIS — L710 Perioral dermatitis: Secondary | ICD-10-CM | POA: Diagnosis not present

## 2021-01-21 MED ORDER — TRIAMCINOLONE ACETONIDE 0.1 % EX CREA: 1.0000 "application " | TOPICAL_CREAM | Freq: Two times a day (BID) | CUTANEOUS | 0 refills | Status: AC

## 2021-01-21 MED ORDER — METRONIDAZOLE 0.75 % EX LOTN: 1.0000 "application " | TOPICAL_LOTION | Freq: Two times a day (BID) | CUTANEOUS | 0 refills | Status: AC

## 2021-01-21 MED ORDER — TRIAMCINOLONE ACETONIDE 0.1 % EX CREA: 1.0000 "application " | TOPICAL_CREAM | Freq: Two times a day (BID) | CUTANEOUS | 0 refills | Status: DC

## 2021-01-21 MED ORDER — METRONIDAZOLE 0.75 % EX LOTN: 1.0000 "application " | TOPICAL_LOTION | Freq: Two times a day (BID) | CUTANEOUS | 0 refills | Status: DC

## 2021-01-21 NOTE — Discharge Instructions (Addendum)
Use metronidazole lotion twice daily.  Keep affected area clean.  Do not like your lips.  Get new cosmetics.  Use triamcinolone on your hand/arm for eczema.  Follow-up with PCP as we discussed.  If anything worsens please come back.

## 2021-01-21 NOTE — ED Triage Notes (Addendum)
Noticed rash to mouth last Wednesday or Thursday.  Rash to lip line and chin.    Patient has also commented on eczema is flaring up and requesting medicine  Patient is very emotional and crying.

## 2021-01-21 NOTE — ED Provider Notes (Signed)
[facility]    CSN: [id number] Arrival date & time: 01/21/21  1719      History   Chief Complaint Chief Complaint  Patient presents with  . Rash    HPI Isabel Scott is a 30 y.o. female.   Patient presents today with a 5-day history of rash surrounding mouth.  She denies any blisters or ulcers noted.  She reports area has continued to spread and initially began as small pustules but have developed into lesions with crusting.  She has tried topical antivirals (Abreva) without improvement of symptoms.  She denies any changes in personal hygiene products including cosmetics or soaps.  She initially thought symptoms were related to hair removal device but replaced this and continues to have symptoms.  She denies any history of dermatological condition.  Reports that she is not sexually active at this time and has not had any sexual activity that would increase risk for herpes.  She denies any associated pain, numbness, tingling, prodrome.  She does report waves of nausea but attributes this to stress.  She reports a very stressful work environment to the point that she is dreading going to work.  She is requesting a few days off of work if appropriate today.  She does not currently have a PCP but is open to establishing with one.     Past Medical History:  Diagnosis Date  . Asthma     Patient Active Problem List   Diagnosis Date Noted  . Contraception management 07/26/2013    Past Surgical History:  Procedure Laterality Date  . REFRACTIVE SURGERY    . TOOTH EXTRACTION  10/04/13   x3    OB History    Gravida  0   Para  0   Term  0   Preterm  0   AB  0   Living  0     SAB  0   IAB  0   Ectopic  0   Multiple  0   Live Births               Home Medications    Prior to Admission medications   Medication Sig Start Date End Date Taking? Authorizing Provider  Camphor-Phenol (CAMPHO PHENIQUE MAXIMUM ST EX) Apply topically.   Yes  [provider]  fluconazole (DIFLUCAN) 150 MG tablet Take 1 tablet (150 mg total) by mouth once. Patient not taking: No sig reported 12/04/13   Douglass Rivers, MD  METRONIDAZOLE, TOPICAL, 0.75 % LOTN Apply 1 application topically in the morning and at bedtime. 01/21/21   Kiearra Oyervides, Noberto Retort, PA-C  triamcinolone cream (KENALOG) 0.1 % Apply 1 application topically 2 (two) times daily. 01/21/21   Cecile Gillispie, Noberto Retort, PA-C    Family History Family History  Problem Relation Age of Onset  . Healthy Mother   . Healthy Father   . Prostate cancer Maternal Grandfather   . Diabetes Paternal Grandmother     Social History Social History   Tobacco Use  . Smoking status: Never Smoker  . Smokeless tobacco: Never Used  Vaping Use  . Vaping Use: Never used  Substance Use Topics  . Alcohol use: No  . Drug use: No     Allergies   Avocado   Review of Systems Review of Systems  Constitutional: Negative for activity change, appetite change, fatigue and fever.  Respiratory: Negative for cough and shortness of breath.   Cardiovascular: Negative for chest pain.  Gastrointestinal: Positive for nausea. Negative  for abdominal pain, diarrhea and vomiting.  Skin: Positive for rash.  Neurological: Negative for dizziness, light-headedness and headaches.  Psychiatric/Behavioral: Positive for dysphoric mood. The patient is nervous/anxious.      Physical Exam Triage Vital Signs ED Triage Vitals  Enc Vitals Group     BP 01/21/21 1810 129/80     Pulse Rate 01/21/21 1810 77     Resp 01/21/21 1810 18     Temp 01/21/21 1810 98.7 F (37.1 C)     Temp Source 01/21/21 1810 Oral     SpO2 01/21/21 1810 100 %     Weight --      Height --      Head Circumference --      Peak Flow --      Pain Score 01/21/21 1805 0     Pain Loc --      Pain Edu? --      Excl. in GC? --    No data found.  Updated Vital Signs BP 129/80 (BP Location: Left Arm)   Pulse 77   Temp 98.7 F (37.1 C) (Oral)   Resp 18    LMP 01/21/2021   SpO2 100%   Visual Acuity Right Eye Distance:   Left Eye Distance:   Bilateral Distance:    Right Eye Near:   Left Eye Near:    Bilateral Near:     Physical Exam Vitals reviewed.  Constitutional:      General: She is awake. She is not in acute distress.    Appearance: Normal appearance. She is not ill-appearing.     Comments: Very pleasant female appears stated age in no acute distress  HENT:     Head: Normocephalic and atraumatic.     Mouth/Throat:     Pharynx: Uvula midline. No oropharyngeal exudate or posterior oropharyngeal erythema.  Cardiovascular:     Rate and Rhythm: Normal rate and regular rhythm.     Heart sounds: No murmur heard.   Pulmonary:     Effort: Pulmonary effort is normal.     Breath sounds: Normal breath sounds. No wheezing, rhonchi or rales.     Comments: Clear to auscultation bilaterally Abdominal:     General: Bowel sounds are normal.     Palpations: Abdomen is soft.     Tenderness: There is no abdominal tenderness. There is no right CVA tenderness, left CVA tenderness, guarding or rebound.  Skin:    Comments: Maculopapular rash surrounding lips with serous crusted drainage noted on several lesions.  No vesicles or ulcerations noted.  No surrounding erythema.  Patient has a papular macular rash with scaling noted left fingers and right forearm.  Psychiatric:        Behavior: Behavior is cooperative.      UC Treatments / Results  Labs (all labs ordered are listed, but only abnormal results are displayed) Labs Reviewed - No data to display  EKG   Radiology No results found.  Procedures Procedures (including critical care time)  Medications Ordered in UC Medications - No data to display  Initial Impression / Assessment and Plan / UC Course  I have reviewed the triage vital signs and the nursing notes.  Pertinent labs & imaging results that were available during my care of the patient were reviewed by me and  considered in my medical decision making (see chart for details).     Rash appears to be perioral dermatitis.  Patient prescribed metronidazole lotion to be used twice daily.  She was  encouraged to use hypoallergenic soaps and detergents and change all cosmetics and hair removal devices.  Discussed potential utility of seeing dermatology should symptoms persist but this would have to be arranged by PCP.  She did request refill of eczema medication which was provided.  She reports significant stressors and we encouraged her to follow-up with PCP once established; we will try to contact her via PCP assistance.  She was provided work excuse note.  Strict return precautions given to which patient expressed understanding.  Final Clinical Impressions(s) / UC Diagnoses   Final diagnoses:  Eczema, unspecified type  Perioral dermatitis  Stress at work     Discharge Instructions      Use metronidazole lotion twice daily.  Keep affected area clean.  Do not like your lips.  Get new cosmetics.  Use triamcinolone on your hand/arm for eczema.  Follow-up with PCP as we discussed.  If anything worsens please come back.    ED Prescriptions    Medication Sig Dispense Auth. Provider   METRONIDAZOLE, TOPICAL, 0.75 % LOTN Apply 1 application topically in the morning and at bedtime. 59 mL Anaisabel Pederson K, PA-C   triamcinolone cream (KENALOG) 0.1 % Apply 1 application topically 2 (two) times daily. 30 g Albertia Carvin, Noberto Retort, PA-C     PDMP not reviewed this encounter.   Jeani Hawking, PA-C 01/21/21 1853

## 2021-01-22 ENCOUNTER — Telehealth (HOSPITAL_BASED_OUTPATIENT_CLINIC_OR_DEPARTMENT_OTHER): Payer: Self-pay

## 2021-01-22 NOTE — Telephone Encounter (Signed)
-----   Message from Aaron Edelman, RN sent at 01/22/2021  3:57 PM EDT ----- Regarding: UC to PCP Patient needs to establish with PCP - routine
